# Patient Record
Sex: Male | Born: 1955 | Race: White | Hispanic: No | Marital: Single | Smoking: Never smoker
Health system: Southern US, Community
[De-identification: ages and names within clinical notes are randomized; demographics above are authoritative.]

## PROBLEM LIST (undated history)

## (undated) DIAGNOSIS — I10 Essential (primary) hypertension: Secondary | ICD-10-CM

## (undated) DIAGNOSIS — K219 Gastro-esophageal reflux disease without esophagitis: Secondary | ICD-10-CM

## (undated) DIAGNOSIS — I2510 Atherosclerotic heart disease of native coronary artery without angina pectoris: Secondary | ICD-10-CM

## (undated) DIAGNOSIS — E785 Hyperlipidemia, unspecified: Secondary | ICD-10-CM

## (undated) HISTORY — PX: CORONARY STENT PLACEMENT: SHX1402

## (undated) SURGERY — Surgical Case
Anesthesia: *Unknown

---

## 2004-09-12 ENCOUNTER — Ambulatory Visit: Payer: Self-pay

## 2005-11-12 ENCOUNTER — Ambulatory Visit: Payer: Self-pay

## 2005-12-16 ENCOUNTER — Emergency Department: Payer: Self-pay | Admitting: Unknown Physician Specialty

## 2005-12-16 ENCOUNTER — Other Ambulatory Visit: Payer: Self-pay

## 2007-06-27 ENCOUNTER — Ambulatory Visit: Payer: Self-pay

## 2009-04-12 ENCOUNTER — Ambulatory Visit: Payer: Self-pay

## 2010-10-06 ENCOUNTER — Observation Stay (HOSPITAL_COMMUNITY)
Admission: EM | Admit: 2010-10-06 | Discharge: 2010-10-07 | Disposition: A | Discharge status: Home / Self Care | Payer: Self-pay | Source: Home / Self Care | Attending: Emergency Medicine | Admitting: Emergency Medicine

## 2010-10-06 LAB — POCT CARDIAC MARKERS
CKMB, poc: <1 ng/mL — ABNORMAL LOW (ref 1.0–8.0)
CKMB, poc: <1 ng/mL — ABNORMAL LOW (ref 1.0–8.0)
CKMB, poc: 1.3 ng/mL (ref 1.0–8.0)
Myoglobin, poc: 58.9 ng/mL (ref 12–200)
Troponin i, poc: <0.05 ng/mL (ref 0.00–0.09)
Troponin i, poc: 0.07 ng/mL (ref 0.00–0.09)

## 2010-10-06 LAB — BASIC METABOLIC PANEL
Calcium: 9.4 mg/dL (ref 8.4–10.5)
GFR calc non Af Amer: >60 mL/min (ref >60)
Glucose, Bld: 134 mg/dL — ABNORMAL HIGH (ref 70–99)
Sodium: 140 mEq/L (ref 135–145)

## 2010-10-06 LAB — CBC
HCT: 47 % (ref 39.0–52.0)
Hemoglobin: 16.9 g/dL (ref 13.0–17.0)
MCV: 93.1 fL (ref 78.0–100.0)
RBC: 5.05 MIL/uL (ref 4.22–5.81)
RDW: 12.2 % (ref 11.5–15.5)
WBC: 11.4 10*3/uL — ABNORMAL HIGH (ref 4.0–10.5)

## 2010-10-06 LAB — DIFFERENTIAL
Basophils Absolute: 0.1 10*3/uL (ref 0.0–0.1)
Eosinophils Relative: 2 % (ref 0–5)
Lymphocytes Relative: 17 % (ref 12–46)
Lymphs Abs: 1.9 10*3/uL (ref 0.7–4.0)
Neutro Abs: 8.2 10*3/uL — ABNORMAL HIGH (ref 1.7–7.7)
Neutrophils Relative %: 72 % (ref 43–77)

## 2010-12-04 ENCOUNTER — Emergency Department: Payer: Self-pay

## 2012-01-02 ENCOUNTER — Encounter (HOSPITAL_COMMUNITY): Payer: Self-pay

## 2012-01-02 ENCOUNTER — Inpatient Hospital Stay (HOSPITAL_COMMUNITY)
Admission: EM | Admit: 2012-01-02 | Discharge: 2012-01-04 | DRG: 247 | Disposition: A | Discharge status: Home / Self Care | Payer: Self-pay | Attending: Interventional Cardiology | Admitting: Interventional Cardiology

## 2012-01-02 ENCOUNTER — Encounter (HOSPITAL_COMMUNITY)
Admission: EM | Disposition: A | Discharge status: Home / Self Care | Payer: Self-pay | Source: Home / Self Care | Attending: Interventional Cardiology

## 2012-01-02 ENCOUNTER — Emergency Department (HOSPITAL_COMMUNITY): Payer: Self-pay

## 2012-01-02 ENCOUNTER — Ambulatory Visit (HOSPITAL_COMMUNITY): Admit: 2012-01-02 | Payer: Self-pay | Admitting: Interventional Cardiology

## 2012-01-02 DIAGNOSIS — E785 Hyperlipidemia, unspecified: Secondary | ICD-10-CM | POA: Diagnosis present

## 2012-01-02 DIAGNOSIS — I498 Other specified cardiac arrhythmias: Secondary | ICD-10-CM | POA: Diagnosis present

## 2012-01-02 DIAGNOSIS — Z955 Presence of coronary angioplasty implant and graft: Secondary | ICD-10-CM

## 2012-01-02 DIAGNOSIS — K219 Gastro-esophageal reflux disease without esophagitis: Secondary | ICD-10-CM | POA: Diagnosis present

## 2012-01-02 DIAGNOSIS — I213 ST elevation (STEMI) myocardial infarction of unspecified site: Secondary | ICD-10-CM

## 2012-01-02 DIAGNOSIS — I2119 ST elevation (STEMI) myocardial infarction involving other coronary artery of inferior wall: Principal | ICD-10-CM | POA: Diagnosis present

## 2012-01-02 DIAGNOSIS — I2510 Atherosclerotic heart disease of native coronary artery without angina pectoris: Secondary | ICD-10-CM | POA: Diagnosis present

## 2012-01-02 HISTORY — DX: Hyperlipidemia, unspecified: E78.5

## 2012-01-02 HISTORY — PX: LEFT HEART CATH: SHX5478

## 2012-01-02 HISTORY — DX: Gastro-esophageal reflux disease without esophagitis: K21.9

## 2012-01-02 LAB — BASIC METABOLIC PANEL
Chloride: 105 mEq/L (ref 96–112)
GFR calc Af Amer: >90 mL/min (ref >90)
Potassium: 3.8 mEq/L (ref 3.5–5.1)
Sodium: 140 mEq/L (ref 135–145)

## 2012-01-02 LAB — CBC
HCT: 42.9 % (ref 39.0–52.0)
HCT: 44.4 % (ref 39.0–52.0)
Hemoglobin: 15.8 g/dL (ref 13.0–17.0)
Hemoglobin: 16.2 g/dL (ref 13.0–17.0)
MCH: 33.5 pg (ref 26.0–34.0)
MCH: 33.5 pg (ref 26.0–34.0)
MCV: 91.9 fL (ref 78.0–100.0)
Platelets: 163 10*3/uL (ref 150–400)
RBC: 4.71 MIL/uL (ref 4.22–5.81)
RBC: 4.83 MIL/uL (ref 4.22–5.81)
WBC: 13.3 10*3/uL — ABNORMAL HIGH (ref 4.0–10.5)

## 2012-01-02 LAB — COMPREHENSIVE METABOLIC PANEL
ALT: 43 U/L (ref 0–53)
AST: 57 U/L — ABNORMAL HIGH (ref 0–37)
Alkaline Phosphatase: 70 U/L (ref 39–117)
CO2: 24 mEq/L (ref 19–32)
Calcium: 9 mg/dL (ref 8.4–10.5)
Chloride: 105 mEq/L (ref 96–112)
GFR calc Af Amer: >90 mL/min (ref >90)
GFR calc non Af Amer: >90 mL/min (ref >90)
Glucose, Bld: 93 mg/dL (ref 70–99)
Sodium: 139 mEq/L (ref 135–145)
Total Bilirubin: 0.7 mg/dL (ref 0.3–1.2)

## 2012-01-02 LAB — POCT I-STAT TROPONIN I: Troponin i, poc: 0.06 ng/mL (ref 0.00–0.08)

## 2012-01-02 LAB — DIFFERENTIAL
Eosinophils Absolute: 0 10*3/uL (ref 0.0–0.7)
Eosinophils Relative: 0 % (ref 0–5)
Lymphocytes Relative: 9 % — ABNORMAL LOW (ref 12–46)
Lymphs Abs: 2.3 10*3/uL (ref 0.7–4.0)
Lymphs Abs: 1.2 10*3/uL (ref 0.7–4.0)
Monocytes Absolute: 0.9 10*3/uL (ref 0.1–1.0)
Monocytes Relative: 7 % (ref 3–12)
Monocytes Relative: 7 % (ref 3–12)
Neutro Abs: 12 10*3/uL — ABNORMAL HIGH (ref 1.7–7.7)
Neutrophils Relative %: 77 % (ref 43–77)

## 2012-01-02 LAB — CARDIAC PANEL(CRET KIN+CKTOT+MB+TROPI)
CK, MB: 42 ng/mL (ref 0.3–4.0)
Relative Index: 5.6 — ABNORMAL HIGH (ref 0.0–2.5)

## 2012-01-02 LAB — APTT: aPTT: >200 seconds (ref 24–37)

## 2012-01-02 LAB — PROTIME-INR: INR: 1.11 (ref 0.00–1.49)

## 2012-01-02 LAB — POCT I-STAT, CHEM 8
BUN: 15 mg/dL (ref 6–23)
Chloride: 109 mEq/L (ref 96–112)
Creatinine, Ser: 1.1 mg/dL (ref 0.50–1.35)
Sodium: 142 mEq/L (ref 135–145)
TCO2: 26 mmol/L (ref 0–100)

## 2012-01-02 SURGERY — LEFT HEART CATH
Anesthesia: LOCAL

## 2012-01-02 MED ORDER — NITROGLYCERIN 0.2 MG/ML ON CALL CATH LAB
INTRAVENOUS | Status: AC
  Filled 2012-01-02: qty 1

## 2012-01-02 MED ORDER — ONDANSETRON HCL 4 MG/2ML IJ SOLN
4.0000 mg | Freq: Once | INTRAMUSCULAR | Status: AC
  Administered 2012-01-02: 4 mg via INTRAVENOUS
  Filled 2012-01-02: qty 2

## 2012-01-02 MED ORDER — SODIUM CHLORIDE 0.9 % IV SOLN
0.2500 mg/kg/h | INTRAVENOUS | Status: AC
  Filled 2012-01-02: qty 250

## 2012-01-02 MED ORDER — FENTANYL CITRATE 0.05 MG/ML IJ SOLN
INTRAMUSCULAR | Status: AC
  Filled 2012-01-02: qty 2

## 2012-01-02 MED ORDER — PRASUGREL HCL 10 MG PO TABS
ORAL_TABLET | ORAL | Status: AC
  Filled 2012-01-02: qty 6

## 2012-01-02 MED ORDER — ACETAMINOPHEN 325 MG PO TABS: 650.0000 mg | ORAL_TABLET | ORAL | Status: DC | PRN

## 2012-01-02 MED ORDER — HEPARIN SODIUM (PORCINE) 5000 UNIT/ML IJ SOLN: 60.0000 [IU]/kg | INTRAMUSCULAR | Status: DC

## 2012-01-02 MED ORDER — ASPIRIN 81 MG PO CHEW
324.0000 mg | CHEWABLE_TABLET | ORAL | Status: AC
  Filled 2012-01-02: qty 1

## 2012-01-02 MED ORDER — OXYCODONE-ACETAMINOPHEN 5-325 MG PO TABS: 1.0000 | ORAL_TABLET | ORAL | Status: DC | PRN

## 2012-01-02 MED ORDER — ONDANSETRON HCL 4 MG/2ML IJ SOLN
INTRAMUSCULAR | Status: AC
  Filled 2012-01-02: qty 2

## 2012-01-02 MED ORDER — ONDANSETRON HCL 4 MG/2ML IJ SOLN: 4.0000 mg | Freq: Four times a day (QID) | INTRAMUSCULAR | Status: DC | PRN

## 2012-01-02 MED ORDER — SODIUM CHLORIDE 0.9 % IV SOLN: INTRAVENOUS | Status: DC

## 2012-01-02 MED ORDER — NITROGLYCERIN 0.4 MG SL SUBL: 0.4000 mg | SUBLINGUAL_TABLET | SUBLINGUAL | Status: DC | PRN

## 2012-01-02 MED ORDER — ASPIRIN EC 325 MG PO TBEC
325.0000 mg | DELAYED_RELEASE_TABLET | Freq: Every day | ORAL | Status: DC
  Filled 2012-01-02: qty 1

## 2012-01-02 MED ORDER — HEPARIN SODIUM (PORCINE) 5000 UNIT/ML IJ SOLN
INTRAMUSCULAR | Status: AC
  Administered 2012-01-02: 4000 [IU]
  Filled 2012-01-02: qty 1

## 2012-01-02 MED ORDER — PRASUGREL HCL 10 MG PO TABS
10.0000 mg | ORAL_TABLET | Freq: Every day | ORAL | Status: DC
  Administered 2012-01-03 – 2012-01-04 (×3): 10 mg via ORAL
  Filled 2012-01-02 (×2): qty 1

## 2012-01-02 MED ORDER — ATROPINE SULFATE 1 MG/ML IJ SOLN
INTRAMUSCULAR | Status: AC
  Filled 2012-01-02: qty 1

## 2012-01-02 MED ORDER — LIDOCAINE HCL (PF) 1 % IJ SOLN
INTRAMUSCULAR | Status: AC
  Filled 2012-01-02: qty 30

## 2012-01-02 MED ORDER — SODIUM CHLORIDE 0.9 % IV SOLN
INTRAVENOUS | Status: DC
  Administered 2012-01-03: 01:00:00 via INTRAVENOUS

## 2012-01-02 MED ORDER — METOPROLOL TARTRATE 12.5 MG HALF TABLET
12.5000 mg | ORAL_TABLET | Freq: Two times a day (BID) | ORAL | Status: DC
  Filled 2012-01-02 (×5): qty 1

## 2012-01-02 MED ORDER — MIDAZOLAM HCL 2 MG/2ML IJ SOLN
INTRAMUSCULAR | Status: AC
  Filled 2012-01-02: qty 2

## 2012-01-02 MED ORDER — MORPHINE SULFATE 2 MG/ML IJ SOLN: 2.0000 mg | INTRAMUSCULAR | Status: DC | PRN

## 2012-01-02 MED ORDER — ASPIRIN 300 MG RE SUPP
300.0000 mg | RECTAL | Status: AC
  Filled 2012-01-02: qty 1

## 2012-01-02 MED ORDER — ATORVASTATIN CALCIUM 40 MG PO TABS
40.0000 mg | ORAL_TABLET | Freq: Every day | ORAL | Status: DC
  Administered 2012-01-03: 40 mg via ORAL
  Filled 2012-01-02 (×2): qty 1

## 2012-01-02 MED ORDER — BIVALIRUDIN 250 MG IV SOLR
INTRAVENOUS | Status: AC
  Filled 2012-01-02: qty 250

## 2012-01-02 MED ORDER — MORPHINE SULFATE 4 MG/ML IJ SOLN
4.0000 mg | Freq: Once | INTRAMUSCULAR | Status: AC
  Administered 2012-01-02: 4 mg via INTRAVENOUS
  Filled 2012-01-02: qty 1

## 2012-01-02 MED ORDER — HEPARIN (PORCINE) IN NACL 2-0.9 UNIT/ML-% IJ SOLN
INTRAMUSCULAR | Status: AC
  Filled 2012-01-02: qty 2000

## 2012-01-02 MED ORDER — HEPARIN BOLUS VIA INFUSION
4000.0000 [IU] | Freq: Once | INTRAVENOUS | Status: AC
  Administered 2012-01-02: 4000 [IU] via INTRAVENOUS

## 2012-01-02 NOTE — Progress Notes (Signed)
01/02/12 1535  Discharge Planning  Type of Residence Private residence  Living Arrangements Other relatives  Home Care Services No  Support Systems Other relatives  Do you have any problems obtaining your medications? Yes (Describe) (pt is self pay)  Once you are discharged, how will you get to your follow-up appointment? Self;Family  Expected Discharge Date 01/05/12  Case Management Consult Needed Yes (Comment)  Social Work Consult Needed No

## 2012-01-02 NOTE — ED Notes (Addendum)
EDP reporting calling STEMI. Pt appearing pale, central CP 3/10. Pt diaphoretic.

## 2012-01-02 NOTE — ED Notes (Signed)
Patient reporting substernal chest pain that began minutes ago.  Will obtain another EKG due to EKG changes from EMS.

## 2012-01-02 NOTE — ED Notes (Signed)
Morphine 4mg  was given PTA as well

## 2012-01-02 NOTE — ED Notes (Signed)
Patient now on Zoll monitor.  Family at bedside, patient resting, vitals stable

## 2012-01-02 NOTE — Progress Notes (Signed)
CRITICAL VALUE ALERT  Critical value received:  troponin 4.6 Date of notification: 01/02/12  Time of notification:  1944  Critical value read back:yes  Nurse who received alert:  Alonna Buckler, RN   MD notified (1st page):  Dr. Anne Fu  Time of first page:  2015  MD notified (2nd page):  Time of second page:  Responding MD:  Dr. Anne Fu  Time MD responded:  2015

## 2012-01-02 NOTE — CV Procedure (Signed)
Diagnostic Cardiac Catheterization Report and Emergency Percutaneous Coronary Intervention  Charles Benjamin  56 y.o.  male 03-11-1956  Procedure Date: 01/02/2012 Referring Physician: None Primary Cardiologist:: Wayne Both, III, MD   PROCEDURE:  Left heart catheterization with selective coronary angiography, left ventriculogram, and drug-eluting stent implantation in the mid right coronary artery.  INDICATIONS:  The patient developed chest pain around 12 noon. According to the patient and the emergency room nurse chest discomfort had resolved on arrival. Approximately one hour after arrival he developed severe pain and EKG demonstrated inferior ST elevation at which time the cath lab was activated.  The risks, benefits, and details of the procedure were explained to the patient.  The patient verbalized understanding and wanted to proceed.  Informed written consent was obtained.  PROCEDURE TECHNIQUE:  After Xylocaine anesthesia a 6 French sheath sheath was placed in the right femoral artery with a single anterior needle wall stick.   Coronary angiography was done using a 6 [place] A2 multipurpose catheter catheter.  Left ventriculography was done using a 6 [place] A2 multipurpose catheter catheter.   Angiography demonstrated a double ostial left coronary system with chronic total occlusion of the proximal/mid LAD with left to left collaterals. Circumflex was widely patent and the right coronary was acutely occluded in the mid vessel.  A 6 [place] JR 4 guide catheter was used to obtain guiding shots in the right coronary. 60 mg of Effient was administered and an IV bivalirudin bolus and infusion was started. We had difficulty crossing the lesion due to angulation within the stenosis that preferentially divergent guidewires into acute marginal branches. We were eventually able to cross the stenosis. A catheter-based thrombectomy run was performed. We then dilated and stented the mid right  coronary using a 24 mm long Promus Element 3.0 mm diameter stent. We post dilated to 3.5 mm with high pressure balloons. TIMI grade 3 flow was noted. No complications occurred. Angio-Seal was used with good hemostasis appear   CONTRAST:  Total of 175 cc.  COMPLICATIONS:  None.    HEMODYNAMICS:  Aortic pressure was 124/61 mmHg; LV pressure was 129/15 mmHg; LVEDP 20 mm mercury.  There was no gradient between the left ventricle and aorta.    ANGIOGRAPHIC DATA:   The left main coronary artery is the left coronary system is a double ostial system and there is no left main visible.  The left anterior descending artery is the left anterior descending is totally occluded after the first diagonal branch. Left to left collaterals fill the LAD retrograde.  The left circumflex artery is widely patent and without any significant obstruction..  The right coronary artery is totally occluded in the mid vessel.  PERCUTANEOUS CORONARY INTERVENTION: The right coronary was dilated and stented with a very nice result. The total occlusion was reduced to 0% with TIMI grade 3 flow.  LEFT VENTRICULOGRAM:  Left ventricular angiogram was done in the 30 RAO projection and revealed normal left ventricular wall motion and systolic function with an estimated ejection fraction of 50% with inferior severe hypokinesis.  IMPRESSIONS:  1. Acute inferior myocardial infarction due to plaque rupture and thrombotic occlusion of the mid RCA.  2. Successful DES implantation in the mid RCA reducing the total occlusion to 0% with TIMI grade 3 flow.  3. Double ostial left coronary system . The left anterior descending is chronically occluded in the proximal to mid segment with left to left collaterals. The circumflex artery is widely patent.  4. Low normal left ventricular function with decreased inferior wall motion. EF 50%  RECOMMENDATION:  1. Statin therapy, aspirin therapy, beta blocker therapy, and phase I cardiac  rehabilitation.  2. Effient at and aspirin.  3. PCI on chronic LAD occlusion at a later date.

## 2012-01-02 NOTE — ED Notes (Signed)
CODE STEMI CALLED. 

## 2012-01-02 NOTE — ED Notes (Signed)
Per EMS: patient from home, working in the yard, developed sudden onset substernal chest pain. EMS gave 324 ASA, 3SL nitro with nearly complete relief of pain. Patient has been seen for same before without conclusion to symptoms.

## 2012-01-02 NOTE — ED Notes (Signed)
Patient belongings and wallet given to Ephraim Hamburger, patient's mother

## 2012-01-02 NOTE — H&P (Signed)
Charles Benjamin is a 56 y.o. male  Admit date: 01/02/2012  Referring Physician: ER Primary Cardiologist: Wayne Both, III, MD Chief complaint / reason for admission:  Acute inferior MI  HPI: The patient presented with a complaint of severe chest pain. He was working in his yard around noontime when severe discomfort started. He came to the [facility] emergency room but upon arrival the discomfort had resolved. The pain came back again severely after been in the emergency room for around an hour. EKG was then diagnostic of acute inferior injury and a code stem he was activated the    PMH:    Past Medical History  Diagnosis Date  . Gastro - esophageal reflux   . Hyperlipidemia     PSH:   History reviewed. No pertinent past surgical history.  ALLERGIES:   Review of patient's allergies indicates no known allergies.  Prior to Admit Meds:   Prescriptions prior to admission  Medication Sig Dispense Refill  . DISCONTD: Aspirin (ANACIN PO) Take 1 tablet by mouth daily.       Family HX:   No family history on file. Social HX:    History   Social History  . Marital Status: Single    Spouse Name: N/A    Number of Children: N/A  . Years of Education: N/A   Occupational History  . Not on file.   Social History Main Topics  . Smoking status: Never Smoker   . Smokeless tobacco: Not on file  . Alcohol Use: No  . Drug Use: No  . Sexually Active:    Other Topics Concern  . Not on file   Social History Narrative  . No narrative on file     ROS: No history of bleeding, stroke, diabetes, hyperlipidemia, smoking, COPD, surgeries, or other significant comorbidities.  Both the father and mother have a history of heart disease. The father died recently and it had multiple stents inserted.  Physical Exam: Blood pressure 135/75, pulse 64, temperature 97.8 F (36.6 C), temperature source Oral, resp. rate 18, height 5' 10.5" (1.791 m), weight 83.915 kg (185 lb), SpO2 100.00%.   Pale  and clammy with diaphoretic skin.  HEENT exam is unremarkable.  Chest is clear to auscultation and percussion.  Cardiac exam feels an S4 gallop. No rub, murmur, or gallop is heard.  Abdomen is soft. Bowel sounds are normal. There is no tenderness. No organomegaly.  Extremities reveal no edema. Femoral pulses posterior tibial pulses and radial pulses are 2+.  The neurological exam reveals patient is very anxious. No focal deficits and noted to Labs:   Lab Results  Component Value Date   WBC 15.6* 01/02/2012   HGB 15.6 01/02/2012   HCT 46.0 01/02/2012   MCV 91.1 01/02/2012   PLT 170 01/02/2012    Lab 01/02/12 1535 01/02/12 1520  NA 142 --  K 3.7 --  CL 109 --  CO2 -- 24  BUN 15 --  CREATININE 1.10 --  CALCIUM -- 9.1  PROT -- --  BILITOT -- --  ALKPHOS -- --  ALT -- --  AST -- --  GLUCOSE 103* --   No results found for this basename: CKTOTAL, CKMB, CKMBINDEX, TROPONINI      Radiology:   EKG:  Inferior ST elevation with sinus bradycardia  ASSESSMENT:  1. Acute inferior STEMI.    Plan:  1. Emergency angiography and possible stenting.     Lesleigh Noe 01/02/2012 5:25 PM

## 2012-01-02 NOTE — Progress Notes (Signed)
Patient Charles Benjamin, 56 year old white male, after having a heart attack, arrived via EMS at the E.D., and was then moved to the Cath Lab.  Chaplain provided pastoral presence, prayer, and conversation to patient's mother and friend before, during, and after patient was admitted to the hospital.  Patient's family expressed appreciation for Chaplain's attention.  I will follow-up as needed.

## 2012-01-02 NOTE — ED Notes (Signed)
I gave the patient a warm blanket. 

## 2012-01-02 NOTE — ED Provider Notes (Signed)
History     CSN: [id number]  Arrival date & time 01/02/12  1402   None     Chief Complaint  Patient presents with  . Chest Pain     The history is provided by the patient.   the patient reports developing chest pain and pressure that began while he was working in his garden.  He's had intermittent chest discomfort before including one year ago when he had a stress test is nondiagnostic for ischemia.  He reports that recently he is intermittently developed chest pressure when he has been walking.  At this time he reports ongoing chest discomfort with shortness of breath.  He denies nausea and vomiting.  He has had slight diaphoresis.  EMS was called and the patient was given aspirin and 3 simple lingual nitroglycerin in route.  The patient reports that he had improvement in his symptoms with nitroglycerin but now his pain is worsening.  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  He reports goes to a doctor as needed.  His symptoms are moderate at this time.  There is a family history of heart disease but not early heart disease.  He denies tobacco abuse.  He denies hypertension hyperlipidemia and diabetes.  History reviewed. No pertinent past medical history.  History reviewed. No pertinent past surgical history.  Family history: Coronary artery disease  History  Substance Use Topics  . Smoking status: Never Smoker   . Smokeless tobacco: Not on file  . Alcohol Use: No      Review of Systems  Cardiovascular: Positive for chest pain.  All other systems reviewed and are negative.    Allergies  Review of patient's allergies indicates no known allergies.  Home Medications   Current Outpatient Rx  Name Route Sig Dispense Refill  . ANACIN PO Oral Take 1 tablet by mouth daily.      BP 124/73  Pulse 64  Temp(Src) 97.8 F (36.6 C) (Oral)  Resp 20  SpO2 99%  Physical Exam  Nursing note and vitals  reviewed. Constitutional: He is oriented to person, place, and time. He appears well-developed and well-nourished.       Patient is holding his chest  HENT:  Head: Normocephalic and atraumatic.  Eyes: EOM are normal.  Neck: Normal range of motion.  Cardiovascular: Normal rate, regular rhythm, normal heart sounds and intact distal pulses.   Pulmonary/Chest: Effort normal and breath sounds normal. No respiratory distress.  Abdominal: Soft. He exhibits no distension. There is no tenderness.  Musculoskeletal: Normal range of motion.  Neurological: He is alert and oriented to person, place, and time.  Skin: Skin is warm and dry.  Psychiatric: He has a normal mood and affect. Judgment normal.    ED Course  Procedures (including critical care time)   Date: 01/02/2012 1409 Rate: 54  Rhythm: normal sinus rhythm  QRS Axis: normal  Intervals: normal  ST/T Wave abnormalities: Nonspecific ST changes  Conduction Disutrbances: none  Narrative Interpretation:   Old EKG Reviewed: Nonspecific ST changes   Date: 01/02/2012 1455  Rate: 53  Rhythm: normal sinus rhythm  QRS Axis: normal  Intervals: normal  ST/T Wave abnormalities: Inferior ST elevation in III and early changes in aVF  Conduction Disutrbances: none  Narrative Interpretation:   Old EKG Reviewed: Change from prior EKG at 1409 , changes not suggestive of inferior ST elevation MI    CRITICAL CARE Performed by: Lyanne Co Total critical  care time: 30 Critical care time was exclusive of separately billable procedures and treating other patients. Critical care was necessary to treat or prevent imminent or life-threatening deterioration. Critical care was time spent personally by me on the following activities: development of treatment plan with patient and/or surrogate as well as nursing, discussions with consultants, evaluation of patient's response to treatment, examination of patient, obtaining history from patient or surrogate,  ordering and performing treatments and interventions, ordering and review of laboratory studies, ordering and review of radiographic studies, pulse oximetry and re-evaluation of patient's condition.  Labs Reviewed  CBC - Abnormal; Notable for the following:    WBC 15.6 (*)    MCHC 36.8 (*)    All other components within normal limits  DIFFERENTIAL - Abnormal; Notable for the following:    Neutro Abs 12.0 (*)    Monocytes Absolute 1.1 (*)    All other components within normal limits  APTT - Abnormal; Notable for the following:    aPTT >200 (*)    All other components within normal limits  BASIC METABOLIC PANEL - Abnormal; Notable for the following:    Glucose, Bld 100 (*)    GFR calc non Af Amer 79 (*)    All other components within normal limits  POCT I-STAT, CHEM 8 - Abnormal; Notable for the following:    Glucose, Bld 103 (*)    All other components within normal limits  PROTIME-INR  POCT I-STAT TROPONIN I   No results found.   1. STEMI (ST elevation myocardial infarction)       MDM  The patient's clinical symptoms are suggestive of ACS.  His initial EKG on arrival to the emergency department is nondiagnostic for ST elevation MI.  The patient had ongoing chest pain and at 2:54 PM a repeat EKG was obtained demonstrating what appears to be increasing inferior ST elevation.  He has ongoing chest discomfort at this time.  This appears to be ST elevation MI/acute coronary syndrome.  The Cath Lab was activated and I discussed the case with Dr. Katrinka Blazing who will by with patient either at the bedside or in the Cath Lab.  Heparin bolus at this time.  The patient has been informed of his diagnosis and the ongoing plan.  All questions answered.  Work on pain control this time        Lyanne Co, MD 01/02/12 1623

## 2012-01-03 DIAGNOSIS — I2119 ST elevation (STEMI) myocardial infarction involving other coronary artery of inferior wall: Secondary | ICD-10-CM | POA: Diagnosis present

## 2012-01-03 LAB — LIPID PANEL
Cholesterol: 172 mg/dL (ref 0–200)
LDL Cholesterol: 97 mg/dL (ref 0–99)
Total CHOL/HDL Ratio: 3.2 RATIO
VLDL: 21 mg/dL (ref 0–40)

## 2012-01-03 LAB — CARDIAC PANEL(CRET KIN+CKTOT+MB+TROPI)
CK, MB: 73.1 ng/mL (ref 0.3–4.0)
Relative Index: 7.6 — ABNORMAL HIGH (ref 0.0–2.5)
Total CK: 904 U/L — ABNORMAL HIGH (ref 7–232)
Total CK: 846 U/L — ABNORMAL HIGH (ref 7–232)

## 2012-01-03 LAB — CBC
HCT: 41.8 % (ref 39.0–52.0)
MCH: 32.8 pg (ref 26.0–34.0)
MCV: 92.1 fL (ref 78.0–100.0)
Platelets: 154 10*3/uL (ref 150–400)
RDW: 12.6 % (ref 11.5–15.5)

## 2012-01-03 LAB — BASIC METABOLIC PANEL
BUN: 11 mg/dL (ref 6–23)
Calcium: 8.5 mg/dL (ref 8.4–10.5)
Creatinine, Ser: 0.97 mg/dL (ref 0.50–1.35)
GFR calc Af Amer: >90 mL/min (ref >90)

## 2012-01-03 LAB — HEMOGLOBIN A1C: Hgb A1c MFr Bld: 5.3 % (ref <5.7)

## 2012-01-03 MED ORDER — ENOXAPARIN SODIUM 40 MG/0.4ML ~~LOC~~ SOLN
40.0000 mg | SUBCUTANEOUS | Status: DC
  Administered 2012-01-03 – 2012-01-04 (×2): 40 mg via SUBCUTANEOUS
  Filled 2012-01-03 (×3): qty 0.4

## 2012-01-03 MED ORDER — ASPIRIN EC 81 MG PO TBEC
81.0000 mg | DELAYED_RELEASE_TABLET | Freq: Every day | ORAL | Status: DC
  Administered 2012-01-03 – 2012-01-04 (×2): 81 mg via ORAL
  Filled 2012-01-03 (×2): qty 1

## 2012-01-03 MED FILL — Dextrose Inj 5%: INTRAVENOUS | Qty: 50 | Status: AC

## 2012-01-03 NOTE — Progress Notes (Signed)
Dr. Katha Cabal notified of HR in the 3s.  Will hold beta blockers for the night. Patient denies CP/SOB/Dizziness.  Vital signs stable.  Zoll at bedside, pads in place.

## 2012-01-03 NOTE — Progress Notes (Addendum)
Patient ID: Charles Benjamin, male   DOB: June 09, 1956, 56 y.o.   MRN: [id number] Subjective:  55 STEMI, inferior, DES to RCA, subtotal LAD (hopeful opened at later date).   Doing well. No SOB, no CP. Ambulating. Groin OK.   Had some bradycardia overnight 48-50. Asymptomatic. Metoprolol 12.5 held.   Had 16 beat narrow complex tachycardia. SVT. 130am.    Objective:  Vital Signs in the last 24 hours: Temp:  [97.8 F (36.6 C)-98.7 F (37.1 C)] 98.7 F (37.1 C) (04/28 0300) Pulse Rate:  [45-76] 52  (04/28 0700) Resp:  [10-20] 13  (04/28 0700) BP: (98-135)/(59-88) 107/63 mmHg (04/28 0700) SpO2:  [96 %-100 %] 98 % (04/28 0800) Weight:  [83.2 kg (183 lb 6.8 oz)-84.1 kg (185 lb 6.5 oz)] 84.1 kg (185 lb 6.5 oz) (04/28 0500)  Intake/Output from previous day: 04/27 0701 - 04/28 0700 In: 2073.8 [P.O.:480; I.V.:1593.8] Out: 1175 [Urine:1175]   Physical Exam: General: Well developed, well nourished, in no acute distress. Head:  Normocephalic and atraumatic. Lungs: Clear to auscultation and percussion. Heart: Normal S1 and S2.  No murmur, rubs or gallops.  Pulses: Pulses normal in all 4 extremities. Cath - angioseal, no hematoma, no bruit Abdomen: soft, non-tender, positive bowel sounds. Extremities: No clubbing or cyanosis. No edema. Neurologic: Alert and oriented x 3.    Lab Results:  Basename 01/03/12 0550 01/02/12 1815  WBC 8.6 13.3*  HGB 14.9 16.2  PLT 154 163    Basename 01/03/12 0550 01/02/12 1815  NA 138 139  K 4.0 4.2  CL 106 105  CO2 23 24  GLUCOSE 93 93  BUN 11 13  CREATININE 0.97 0.94    Basename 01/03/12 0550 01/03/12 0008  TROPONINI 16.08* 23.55*   Hepatic Function Panel  Basename 01/02/12 1815  PROT 6.7  ALBUMIN 4.0  AST 57*  ALT 43  ALKPHOS 70  BILITOT 0.7  BILIDIR --  IBILI --    Basename 01/03/12 0550  CHOL 172   No results found for this basename: PROTIME in the last 72 hours  Imaging: Dg Chest Port 1 View  01/02/2012  *RADIOLOGY REPORT*   Clinical Data: Chest pain and shortness of breath.  PORTABLE CHEST - 1 VIEW  Comparison: 10/06/2010.  Findings: Interval borderline enlarged cardiac silhouette.  Clear lungs with normal vascularity.  Normal appearing bones.  IMPRESSION: Borderline cardiomegaly.  Original Report Authenticated By: Charles Benjamin, M.D.   Personally viewed.   Telemetry: SVT as above, brady also Personally viewed.   EKG:  SB 51, inf infarct pattern, no ST changes  Assessment/Plan:  Principal Problem:  *Inferior myocardial infarction  STEMI - Inferior. DES to RCA, Effient, ASA. Brady overnight while sleeping. Currently 58 bpm. Given MI, will try low dose Bb 12.5 metoprolol.  Transfer to tele.  LDL goal 70, now 97 Will stop IVF  Cardiac rehab  CAD - subtotal LAD. Later date intervention. Attempt to recanalize.  Discussed with patient.   Ambulate.  DVT prophal. Charles Benjamin 01/03/2012, 8:03 AM

## 2012-01-04 LAB — BASIC METABOLIC PANEL
BUN: 13 mg/dL (ref 6–23)
Calcium: 9 mg/dL (ref 8.4–10.5)
Creatinine, Ser: 1.02 mg/dL (ref 0.50–1.35)
GFR calc non Af Amer: 82 mL/min — ABNORMAL LOW (ref >90)
Glucose, Bld: 98 mg/dL (ref 70–99)
Sodium: 139 mEq/L (ref 135–145)

## 2012-01-04 LAB — CBC
MCH: 32.5 pg (ref 26.0–34.0)
MCHC: 34.9 g/dL (ref 30.0–36.0)
Platelets: 141 10*3/uL — ABNORMAL LOW (ref 150–400)

## 2012-01-04 LAB — POCT ACTIVATED CLOTTING TIME: Activated Clotting Time: 523 seconds

## 2012-01-04 MED ORDER — METOPROLOL SUCCINATE ER 25 MG PO TB24: 25.0000 mg | ORAL_TABLET | Freq: Every day | ORAL | Status: DC

## 2012-01-04 MED ORDER — PRAVASTATIN SODIUM 40 MG PO TABS: 40.0000 mg | ORAL_TABLET | Freq: Every day | ORAL | Status: DC

## 2012-01-04 MED ORDER — METOPROLOL SUCCINATE ER 25 MG PO TB24
25.0000 mg | ORAL_TABLET | Freq: Every day | ORAL | Status: DC
  Administered 2012-01-04: 25 mg via ORAL
  Filled 2012-01-04: qty 1

## 2012-01-04 MED ORDER — HEART ATTACK BOUNCING BOOK
Freq: Once | Status: AC
  Administered 2012-01-04: 11:00:00
  Filled 2012-01-04: qty 1

## 2012-01-04 MED ORDER — NITROGLYCERIN 0.4 MG SL SUBL: 0.4000 mg | SUBLINGUAL_TABLET | SUBLINGUAL | Status: DC | PRN

## 2012-01-04 MED ORDER — ASPIRIN EC 325 MG PO TBEC: 325.0000 mg | DELAYED_RELEASE_TABLET | Freq: Every morning | ORAL | Status: AC

## 2012-01-04 MED ORDER — PRASUGREL HCL 10 MG PO TABS: 10.0000 mg | ORAL_TABLET | Freq: Every day | ORAL | Status: DC

## 2012-01-04 MED ORDER — ATORVASTATIN CALCIUM 40 MG PO TABS: 40.0000 mg | ORAL_TABLET | Freq: Every day | ORAL | Status: DC

## 2012-01-04 MED ORDER — ACTIVE PARTNERSHIP FOR HEALTH OF YOUR HEART BOOK
Freq: Once | Status: AC
  Administered 2012-01-04: 11:00:00
  Filled 2012-01-04: qty 1

## 2012-01-04 NOTE — Progress Notes (Signed)
Pt received discharge instructions. Went over each medication and importance of taking them. Pt received heart attack book and heart healthy book. Gave print outs on metoprolol and heart healthy diet. Pt received effient card for prescription and is aware to get at [facility] for cheapest price. Knows when follow up appointment is. Will take BP/HR regularly and record to bring to appointment, along with prescriptions. Went over incision site care and when to call the doctor. Pt has no further questions at this time. Stable for D/C. Will D/C IV and tele once ride is here. Duwaine Maxin, RN

## 2012-01-04 NOTE — Discharge Instructions (Signed)
Follow the progression instructions of cardiac rehab and no work until returns to see Dr Katrinka Blazing.

## 2012-01-04 NOTE — Discharge Summary (Addendum)
Patient ID: Charles Benjamin MRN: [id number] DOB/AGE: [birth year] 56 y.o.  Admit date: 01/02/2012 Discharge date: 01/04/2012  Primary Discharge Diagnosis: Acute inferior STEMI Secondary Discharge Diagnosis :1. Hyperlipidemia  Significant Diagnostic Studies: Emergency acth and DES RCA, 01/02/12  Consults: Cardiac Rehab  Hospital Course: He presented with chest pain that occurred while mowing. He was pain free upon ER arrival and noted to have recurrent CP and STE inferiorly one hour after presentation. He underwent successful RCA DES and was also noted to have chronic total RCA occlusion. He had no recurrence of symptoms following the procedure. Had Cardiac Rehab I and did well.  Will plan to do PCI on the LAD CTO in 2-3 weeks.   Discharge Exam: Blood pressure 116/68, pulse 56, temperature 98.3 F (36.8 C), temperature source Oral, resp. rate 16, height 5\' 11"  (1.803 m), weight 84.6 kg (186 lb 8.2 oz), SpO2 97.00%.    The right groin cath site is unremarkable. Cardiac exam is unremarkable. Neuro okay   Labs:   Lab Results  Component Value Date   WBC 7.2 01/04/2012   HGB 15.4 01/04/2012   HCT 44.1 01/04/2012   MCV 93.0 01/04/2012   PLT 141* 01/04/2012     Lab 01/04/12 0130 01/02/12 1815  NA 139 --  K 3.8 --  CL 105 --  CO2 24 --  BUN 13 --  CREATININE 1.02 --  CALCIUM 9.0 --  PROT -- 6.7  BILITOT -- 0.7  ALKPHOS -- 70  ALT -- 43  AST -- 57*  GLUCOSE 98 --   Lab Results  Component Value Date   CKTOTAL 846* 01/03/2012   CKMB 64.0* 01/03/2012   TROPONINI 16.08* 01/03/2012    Lab Results  Component Value Date   CHOL 172 01/03/2012   Lab Results  Component Value Date   HDL 54 01/03/2012   Lab Results  Component Value Date   LDLCALC 97 01/03/2012   Lab Results  Component Value Date   TRIG 104 01/03/2012   Lab Results  Component Value Date   CHOLHDL 3.2 01/03/2012      Radiology: Mild cardiomegaly  EKG: NSR with small inferior Q waves and TWI in 3 and  F  FOLLOW UP PLANS AND APPOINTMENTS Discharge Orders    Future Orders Please Complete By Expires   Amb Referral to Cardiac Rehabilitation      Comments:   Going to [facility] Phase 2     Medication List  As of 01/04/2012 10:31 AM   STOP taking these medications         ANACIN PO         TAKE these medications         aspirin EC 325 MG tablet   Take 1 tablet (325 mg total) by mouth every morning.      metoprolol succinate 25 MG 24 hr tablet   Commonly known as: TOPROL-XL   Take 1 tablet (25 mg total) by mouth daily.      nitroGLYCERIN 0.4 MG SL tablet   Commonly known as: NITROSTAT   Place 1 tablet (0.4 mg total) under the tongue every 5 (five) minutes x 3 doses as needed for chest pain.      prasugrel 10 MG Tabs   Commonly known as: EFFIENT   Take 1 tablet (10 mg total) by mouth daily.      pravastatin 40 MG tablet   Commonly known as: PRAVACHOL   Take 1 tablet (40 mg total) by mouth  daily.           Follow-up Information    Follow up with Lesleigh Noe, MD on 01/11/2012. (11:15 am)    Contact information:   [address] [place] [address] [place] [address] [phone]          BRING ALL MEDICATIONS WITH YOU TO FOLLOW UP APPOINTMENTS  Time spent with patient to include physician time: 30 min Signed: Lesleigh Noe 01/04/2012, 10:31 AM

## 2012-01-04 NOTE — Progress Notes (Signed)
CARDIAC REHAB PHASE I   PRE:  Rate/Rhythm: 59 SB    BP: sitting 110/60    SaO2:   MODE:  Ambulation: 460 ft   POST:  Rate/Rhythm: 72 SR    BP: sitting 128/78     SaO2:   Tolerated well, no c/o. Feels good. Sts he has had chest tightness for years and even came to ER 1 yr ago with negative GXT. Ed completed and pt requests his name be [facility] to [facility]. Eager to be compliant. Does not have insurance so will need Effient assist. Notified CM. [phone]  Harriet Masson CES, ACSM    59 SB

## 2012-01-04 NOTE — Progress Notes (Signed)
UR Completed. Simmons, Debie Ashline F [phone]  

## 2012-01-04 NOTE — Progress Notes (Signed)
   CARE MANAGEMENT NOTE 01/04/2012  Patient:  Charles Benjamin, Charles Benjamin   Account Number:  [id number]  Date Initiated:  01/04/2012  Documentation initiated by:  GRAVES-BIGELOW,Ivett Luebbe  Subjective/Objective Assessment:   Pt admitted with stemi. S/p LHC. Plan for home on effient. Pt does not have insurance and does not work. Pt uses [facility] and CM did call [address] and effient is available. He will also pick up nitro.     Action/Plan:   CM did ,make pt aware of [facility] $4.00 med list and he will pick up statin, asa and metoprolol. Pt states he has PCP and his mom will be able to help him purchase meds. CM will fax pt assist form to [facility].   Anticipated DC Date:  01/04/2012   Anticipated DC Plan:  HOME/SELF CARE      DC Planning Services  CM consult      Choice offered to / List presented to:             Status of service:  Completed, signed off Medicare Important Message given?   (If response is "NO", the following Medicare IM given date fields will be blank) Date Medicare IM given:   Date Additional Medicare IM given:    Discharge Disposition:  HOME/SELF CARE  Per UR Regulation:    If discussed at Long Length of Stay Meetings, dates discussed:    Comments:  01-04-12 1059 Tomi Bamberger, RN,BSN [phone] CM did make pt aware that he needs to be compliant with medications. CM did state to pt that if he  is not approved by the time the 30 day free effient runs out to contact MD and to ask for samples. RN to call CM at d/c in order to fax [facility] forms and Rx to [facility]. Pt is aware of the pharmacies that have cheaper prices. Will continue to f/u.

## 2013-01-25 ENCOUNTER — Inpatient Hospital Stay (HOSPITAL_COMMUNITY)
Admission: EM | Admit: 2013-01-25 | Discharge: 2013-01-27 | DRG: 287 | Disposition: A | Discharge status: Home / Self Care | Payer: No Typology Code available for payment source | Attending: Interventional Cardiology | Admitting: Interventional Cardiology

## 2013-01-25 ENCOUNTER — Emergency Department (HOSPITAL_COMMUNITY): Payer: No Typology Code available for payment source

## 2013-01-25 ENCOUNTER — Encounter (HOSPITAL_COMMUNITY): Payer: Self-pay | Admitting: Emergency Medicine

## 2013-01-25 DIAGNOSIS — I200 Unstable angina: Secondary | ICD-10-CM

## 2013-01-25 DIAGNOSIS — I2582 Chronic total occlusion of coronary artery: Secondary | ICD-10-CM | POA: Diagnosis present

## 2013-01-25 DIAGNOSIS — Z87891 Personal history of nicotine dependence: Secondary | ICD-10-CM

## 2013-01-25 DIAGNOSIS — I471 Supraventricular tachycardia, unspecified: Secondary | ICD-10-CM

## 2013-01-25 DIAGNOSIS — K219 Gastro-esophageal reflux disease without esophagitis: Secondary | ICD-10-CM | POA: Diagnosis present

## 2013-01-25 DIAGNOSIS — I498 Other specified cardiac arrhythmias: Secondary | ICD-10-CM | POA: Diagnosis present

## 2013-01-25 DIAGNOSIS — Z7982 Long term (current) use of aspirin: Secondary | ICD-10-CM

## 2013-01-25 DIAGNOSIS — I2510 Atherosclerotic heart disease of native coronary artery without angina pectoris: Principal | ICD-10-CM

## 2013-01-25 DIAGNOSIS — Z79899 Other long term (current) drug therapy: Secondary | ICD-10-CM

## 2013-01-25 DIAGNOSIS — I10 Essential (primary) hypertension: Secondary | ICD-10-CM

## 2013-01-25 DIAGNOSIS — E785 Hyperlipidemia, unspecified: Secondary | ICD-10-CM | POA: Diagnosis present

## 2013-01-25 DIAGNOSIS — E7800 Pure hypercholesterolemia, unspecified: Secondary | ICD-10-CM

## 2013-01-25 DIAGNOSIS — I252 Old myocardial infarction: Secondary | ICD-10-CM

## 2013-01-25 HISTORY — DX: Essential (primary) hypertension: I10

## 2013-01-25 HISTORY — DX: Atherosclerotic heart disease of native coronary artery without angina pectoris: I25.10

## 2013-01-25 LAB — COMPREHENSIVE METABOLIC PANEL
Alkaline Phosphatase: 61 U/L (ref 39–117)
BUN: 13 mg/dL (ref 6–23)
CO2: 19 mEq/L (ref 19–32)
Chloride: 103 mEq/L (ref 96–112)
Creatinine, Ser: 0.91 mg/dL (ref 0.50–1.35)
GFR calc non Af Amer: >90 mL/min (ref >90)
Glucose, Bld: 95 mg/dL (ref 70–99)
Potassium: 3.6 mEq/L (ref 3.5–5.1)
Total Bilirubin: 0.7 mg/dL (ref 0.3–1.2)

## 2013-01-25 LAB — BASIC METABOLIC PANEL
BUN: 14 mg/dL (ref 6–23)
Calcium: 10.1 mg/dL (ref 8.4–10.5)
Creatinine, Ser: 0.94 mg/dL (ref 0.50–1.35)
GFR calc Af Amer: >90 mL/min (ref >90)
GFR calc non Af Amer: >90 mL/min (ref >90)
Glucose, Bld: 101 mg/dL — ABNORMAL HIGH (ref 70–99)
Potassium: 4 mEq/L (ref 3.5–5.1)

## 2013-01-25 LAB — CBC
HCT: 47.9 % (ref 39.0–52.0)
Hemoglobin: 18 g/dL — ABNORMAL HIGH (ref 13.0–17.0)
MCH: 32.9 pg (ref 26.0–34.0)
MCHC: 37.6 g/dL — ABNORMAL HIGH (ref 30.0–36.0)
RDW: 12 % (ref 11.5–15.5)

## 2013-01-25 LAB — TROPONIN I: Troponin I: <0.3 ng/mL (ref <0.30)

## 2013-01-25 LAB — PROTIME-INR: INR: 0.9 (ref 0.00–1.49)

## 2013-01-25 MED ORDER — SODIUM CHLORIDE 0.9 % IV SOLN: 250.0000 mL | INTRAVENOUS | Status: DC | PRN

## 2013-01-25 MED ORDER — ASPIRIN 300 MG RE SUPP
300.0000 mg | RECTAL | Status: AC
  Filled 2013-01-25: qty 1

## 2013-01-25 MED ORDER — SODIUM CHLORIDE 0.9 % IJ SOLN: 3.0000 mL | INTRAMUSCULAR | Status: DC | PRN

## 2013-01-25 MED ORDER — NITROGLYCERIN 0.4 MG SL SUBL
0.4000 mg | SUBLINGUAL_TABLET | SUBLINGUAL | Status: DC | PRN
  Administered 2013-01-25: 0.4 mg via SUBLINGUAL

## 2013-01-25 MED ORDER — ASPIRIN EC 325 MG PO TBEC
325.0000 mg | DELAYED_RELEASE_TABLET | Freq: Every day | ORAL | Status: DC
Start: 2013-01-27 — End: 2013-01-27
  Administered 2013-01-27: 09:00:00 325 mg via ORAL
  Filled 2013-01-25: qty 1

## 2013-01-25 MED ORDER — SODIUM CHLORIDE 0.9 % IJ SOLN
3.0000 mL | Freq: Two times a day (BID) | INTRAMUSCULAR | Status: DC
  Administered 2013-01-25 – 2013-01-26 (×2): 3 mL via INTRAVENOUS

## 2013-01-25 MED ORDER — ASPIRIN 325 MG PO TABS
325.0000 mg | ORAL_TABLET | ORAL | Status: AC
  Administered 2013-01-25: 325 mg via ORAL
  Filled 2013-01-25: qty 1

## 2013-01-25 MED ORDER — ASPIRIN 81 MG PO CHEW: 324.0000 mg | CHEWABLE_TABLET | ORAL | Status: AC

## 2013-01-25 MED ORDER — SODIUM CHLORIDE 0.9 % IV SOLN
INTRAVENOUS | Status: DC
  Administered 2013-01-25 – 2013-01-26 (×2): via INTRAVENOUS

## 2013-01-25 MED ORDER — METOPROLOL SUCCINATE ER 25 MG PO TB24
25.0000 mg | ORAL_TABLET | Freq: Every day | ORAL | Status: DC
  Administered 2013-01-25 – 2013-01-26 (×2): 25 mg via ORAL
  Filled 2013-01-25 (×3): qty 1

## 2013-01-25 MED ORDER — ACETAMINOPHEN 325 MG PO TABS
650.0000 mg | ORAL_TABLET | ORAL | Status: DC | PRN
  Administered 2013-01-26: 650 mg via ORAL
  Filled 2013-01-25: qty 2

## 2013-01-25 MED ORDER — HEPARIN BOLUS VIA INFUSION
4000.0000 [IU] | Freq: Once | INTRAVENOUS | Status: AC
  Administered 2013-01-25: 4000 [IU] via INTRAVENOUS

## 2013-01-25 MED ORDER — ONDANSETRON HCL 4 MG/2ML IJ SOLN
4.0000 mg | Freq: Four times a day (QID) | INTRAMUSCULAR | Status: DC | PRN
  Administered 2013-01-26: 4 mg via INTRAVENOUS
  Filled 2013-01-25: qty 2

## 2013-01-25 MED ORDER — DIAZEPAM 5 MG PO TABS
5.0000 mg | ORAL_TABLET | ORAL | Status: AC
  Administered 2013-01-26: 5 mg via ORAL
  Filled 2013-01-25: qty 1

## 2013-01-25 MED ORDER — ASPIRIN EC 325 MG PO TBEC: 325.0000 mg | DELAYED_RELEASE_TABLET | Freq: Every day | ORAL | Status: DC

## 2013-01-25 MED ORDER — SIMVASTATIN 20 MG PO TABS
20.0000 mg | ORAL_TABLET | Freq: Every day | ORAL | Status: DC
  Administered 2013-01-26: 20 mg via ORAL
  Filled 2013-01-25 (×4): qty 1

## 2013-01-25 MED ORDER — ISOSORBIDE MONONITRATE ER 30 MG PO TB24
30.0000 mg | ORAL_TABLET | Freq: Every day | ORAL | Status: DC
  Administered 2013-01-25: 30 mg via ORAL
  Filled 2013-01-25 (×2): qty 1

## 2013-01-25 MED ORDER — NITROGLYCERIN 0.4 MG SL SUBL: 0.4000 mg | SUBLINGUAL_TABLET | SUBLINGUAL | Status: DC | PRN

## 2013-01-25 MED ORDER — ASPIRIN 81 MG PO CHEW
324.0000 mg | CHEWABLE_TABLET | ORAL | Status: AC
  Administered 2013-01-26: 324 mg via ORAL
  Filled 2013-01-25 (×2): qty 4

## 2013-01-25 MED ORDER — HEPARIN (PORCINE) IN NACL 100-0.45 UNIT/ML-% IJ SOLN
950.0000 [IU]/h | INTRAMUSCULAR | Status: DC
  Administered 2013-01-25: 1200 [IU]/h via INTRAVENOUS
  Administered 2013-01-26: 950 [IU]/h via INTRAVENOUS
  Filled 2013-01-25 (×3): qty 250

## 2013-01-25 NOTE — ED Notes (Signed)
Patient transported to X-ray 

## 2013-01-25 NOTE — H&P (Signed)
Admit date: 01/25/2013 Primary Physician: None on file Primary Cardiologist: Dr. Katrinka Blazing  CC: Chest pain  HPI: 57 year old with coronary artery disease status post inferior myocardial infarction with RCA stent placement, CTO of LAD, former smoker, GERD, hyperlipidemia, hypertension who presented with onset of angina at rest, 3 AM this morning, took Pepcid without relief of chest pressure/tightness with mild diaphoresis. He states that this feels different from his GERD pain. He has not had cholecystectomy. No change with food. Over the past 3-4 weeks he has felt some increase in discomfort which can occur at both rest and with exertion. He works at [employer]. In the past, some of his chest discomfort has felt to be musculoskeletal. He received nitroglycerin and this seemed to help with his discomfort. He was placed on IV heparin in the emergency room. Initial troponin is normal. EKG does not demonstrate ST segment elevation or depression but does show evidence of paroxysmal atrial tachycardia. He does not endorse palpitations.  His inferior myocardial infarction with RCA stent placement occurred on 01/02/12.   Catheterization: 01/02/12  PERCUTANEOUS CORONARY INTERVENTION: The right coronary was dilated and stented with a very nice result. The total occlusion was reduced to 0% with TIMI grade 3 flow.  LEFT VENTRICULOGRAM: Left ventricular angiogram was done in the 30 RAO projection and revealed normal left ventricular wall motion and systolic function with an estimated ejection fraction of 50% with inferior severe hypokinesis.  IMPRESSIONS: 1. Acute inferior myocardial infarction due to plaque rupture and thrombotic occlusion of the mid RCA.  2. Successful DES implantation in the mid RCA reducing the total occlusion to 0% with TIMI grade 3 flow.  3. Double ostial left coronary system . The left anterior descending is chronically occluded in the proximal to mid segment with left to left collaterals. The  circumflex artery is widely patent.  4. Low normal left ventricular function with decreased inferior wall motion. EF 50%   PMH:   Past Medical History  Diagnosis Date  . Gastro - esophageal reflux   . Hyperlipidemia   . Coronary artery disease     Inferior myocardial infarction-RCA DES, stent 01/02/12, CTO of prox-mid LAD  . Hypertension     PSH:   Past Surgical History  Procedure Laterality Date  . Coronary stent placement     Allergies:  Shrimp and Pravastatin  Prior to Admit Meds:   (Not in a hospital admission) Prior to Admission medications   Medication Sig Start Date End Date Taking? Authorizing Provider  aspirin 325 MG EC tablet Take 325 mg by mouth daily.   Yes Historical Provider, MD  bismuth subsalicylate (PEPTO BISMOL) 262 MG chewable tablet Chew 524 mg by mouth as needed for indigestion or heartburn.   Yes Historical Provider, MD  metoprolol succinate (TOPROL-XL) 25 MG 24 hr tablet Take 1 tablet (25 mg total) by mouth daily. 01/04/12 01/03/13  Quintella Reichert, MD  nitroGLYCERIN (NITROSTAT) 0.4 MG SL tablet Place 1 tablet (0.4 mg total) under the tongue every 5 (five) minutes x 3 doses as needed for chest pain. 01/04/12 01/03/13  Quintella Reichert, MD  pravastatin (PRAVACHOL) 40 MG tablet Take 1 tablet (40 mg total) by mouth daily. 01/04/12 01/03/13  Quintella Reichert, MD   Fam HX:   No family history on file. Social HX:    History   Social History  . Marital Status: Single    Spouse Name: N/A    Number of Children: N/A  . Years of Education: N/A  Occupational History  . Not on file.   Social History Main Topics  . Smoking status: Never Smoker   . Smokeless tobacco: Not on file  . Alcohol Use: No  . Drug Use: No  . Sexually Active: Not on file   Other Topics Concern  . Not on file   Social History Narrative  . No narrative on file     ROS:  All 11 ROS were addressed and are negative except what is stated in the HPI  Physical Exam: Blood pressure 118/85, pulse  37, temperature 97.6 F (36.4 C), temperature source Oral, resp. rate 9, SpO2 98.00%.    General: Well developed, well nourished, in no acute distress, mildly anxious Head: Eyes PERRLA, No xanthomas.   Normal cephalic and atramatic  Lungs:   Clear bilaterally to auscultation and percussion. Normal respiratory effort. No wheezes, no rales. Heart:   HRRR S1 S2 Pulses are 2+ & equal.No murmurs, rubs, gallops.   No carotid bruit. No JVD.  No abdominal bruits. No femoral bruits. Abdomen: Bowel sounds are positive, abdomen soft and non-tender without masses. No hepatosplenomegaly. Msk:  Back normal, normal gait. Normal strength and tone for age. Extremities:   No clubbing, cyanosis or edema.  DP +1 Neuro: Alert and oriented X 3, non-focal, MAE x 4 GU: Deferred Rectal: Deferred Psych:  Good affect, responds appropriately    Labs:   Lab Results  Component Value Date   WBC 5.7 01/25/2013   HGB 18.0* 01/25/2013   HCT 47.9 01/25/2013   MCV 87.6 01/25/2013   PLT 176 01/25/2013     Recent Labs Lab 01/25/13 0709  NA 139  K 4.0  CL 103  CO2 22  BUN 14  CREATININE 0.94  CALCIUM 10.1  GLUCOSE 101*   No results found for this basename: PTT   Lab Results  Component Value Date   INR 0.90 01/25/2013   INR 1.11 01/02/2012   Lab Results  Component Value Date   CKTOTAL 846* 01/03/2012   CKMB 64.0* 01/03/2012   TROPONINI 16.08* 01/03/2012     Lab Results  Component Value Date   CHOL 172 01/03/2012   Lab Results  Component Value Date   HDL 54 01/03/2012   Lab Results  Component Value Date   LDLCALC 97 01/03/2012   Lab Results  Component Value Date   TRIG 104 01/03/2012   Lab Results  Component Value Date   CHOLHDL 3.2 01/03/2012   No results found for this basename: LDLDIRECT      Radiology:  Dg Chest 2 View  01/25/2013   *RADIOLOGY REPORT*  Clinical Data: Chest pain.  Shortness of breath.  CHEST - 2 VIEW  Comparison: 01/02/2012 and 10/06/2010  Findings: The heart size and  pulmonary vascularity are normal and the lungs are clear.  No osseous abnormality. Coronary artery stents noted.  IMPRESSION: No acute abnormality.   Original Report Authenticated By: Francene Boyers, M.D.   Personally viewed.   EKG:  Normal sinus rhythm, no ST segment changes, no obvious Q waves inferiorly, short bursts of paroxysmal atrial tachycardia, P waves clear. Prior EKG done in office setting demonstrates sinus rhythm. Personally viewed.  ASSESSMENT/PLAN:   57 year old male with coronary artery disease, RCA DES, complete occlusion of proximal to mid LAD, inferior wall myocardial infarction 4/13, hypertension, hyperlipidemia, former smoker here with chest pain concerning for unstable angina.   1. Unstable angina-agree with IV heparin, aspirin, beta blocker, statin. Based upon his story, I will plan for  cardiac catheterization tomorrow by Dr. Katrinka Blazing. N.p.o. past midnight. Perhaps, correction of complete occlusion of LAD may help with symptoms. It is likely that nuclear stress test will demonstrate ischemia in the anterior wall territory. Left to left collaterals fill the LAD retrograde. He is having ongoing symptoms despite antianginal therapy of metoprolol at home. Nitroglycerin has been utilize sublingual as well. I will start isosorbide 30 mg. I will hold on giving clopidogrel in case surgical correction of LAD is warranted. Nonetheless, it would likely be started tomorrow.  2. Hyperlipidemia-continue with statin therapy, check LDL  3. Hypertension-currently well controlled. Continue with metoprolol.  Donato Schultz, MD  01/25/2013  9:31 AM

## 2013-01-25 NOTE — ED Provider Notes (Addendum)
History     CSN: [id number]  Arrival date & time 01/25/13  4098   First MD Initiated Contact with Patient 01/25/13 [phone]      Chief Complaint  Patient presents with  . Chest Pain    (Consider location/radiation/quality/duration/timing/severity/associated sxs/prior treatment) HPI Comments: Pt with hx of CAD, s/p DES stent to RCA last year, HL, comes in with cc of chest pain. Pt started having chest pain x several weeks and is mid-sternal, described as pressure like pain with some associated nausea, diaphoresis. The pain is typically associated with walking and improved with rest. The pain is not same as his MI. He denies any illicit substance abuse. He comes in today, as the pain frequency is increasing, and because he had pain while sleeping today.  Patient is a 57 y.o. male presenting with chest pain. The history is provided by the patient and medical records.  Chest Pain Associated symptoms: diaphoresis and nausea   Associated symptoms: no cough, no dizziness, no fever, no headache and no shortness of breath     Past Medical History  Diagnosis Date  . Gastro - esophageal reflux   . Hyperlipidemia   . Coronary artery disease   . Hypertension     Past Surgical History  Procedure Laterality Date  . Coronary stent placement      No family history on file.  History  Substance Use Topics  . Smoking status: Never Smoker   . Smokeless tobacco: Not on file  . Alcohol Use: No      Review of Systems  Constitutional: Positive for diaphoresis. Negative for fever, chills and activity change.  HENT: Negative for neck pain.   Eyes: Negative for visual disturbance.  Respiratory: Negative for cough, chest tightness and shortness of breath.   Cardiovascular: Positive for chest pain.  Gastrointestinal: Positive for nausea. Negative for abdominal distention.  Genitourinary: Negative for dysuria, enuresis and difficulty urinating.  Musculoskeletal: Negative for arthralgias.   Neurological: Negative for dizziness, light-headedness and headaches.  Psychiatric/Behavioral: Negative for confusion.    Allergies  Shrimp and Pravastatin  Home Medications   Current Outpatient Rx  Name  Route  Sig  Dispense  Refill  . aspirin 325 MG EC tablet   Oral   Take 325 mg by mouth daily.         Marland Kitchen bismuth subsalicylate (PEPTO BISMOL) 262 MG chewable tablet   Oral   Chew 524 mg by mouth as needed for indigestion or heartburn.         Marland Kitchen EXPIRED: metoprolol succinate (TOPROL-XL) 25 MG 24 hr tablet   Oral   Take 1 tablet (25 mg total) by mouth daily.   30 tablet   11   . EXPIRED: nitroGLYCERIN (NITROSTAT) 0.4 MG SL tablet   Sublingual   Place 1 tablet (0.4 mg total) under the tongue every 5 (five) minutes x 3 doses as needed for chest pain.   25 tablet   5   . EXPIRED: pravastatin (PRAVACHOL) 40 MG tablet   Oral   Take 1 tablet (40 mg total) by mouth daily.   30 tablet   11     BP 162/82  Pulse 74  Temp(Src) 97.6 F (36.4 C) (Oral)  Resp 18  SpO2 97%  Physical Exam  Nursing note and vitals reviewed. Constitutional: He is oriented to person, place, and time. He appears well-developed.  HENT:  Head: Normocephalic and atraumatic.  Eyes: Conjunctivae and EOM are normal. Pupils are equal, round, and reactive  to light.  Neck: Normal range of motion. Neck supple.  Cardiovascular: Normal rate and regular rhythm.   Pulmonary/Chest: Effort normal and breath sounds normal.  Abdominal: Soft. Bowel sounds are normal. He exhibits no distension. There is no tenderness. There is no rebound and no guarding.  Neurological: He is alert and oriented to person, place, and time.  Skin: Skin is warm.    ED Course  Procedures (including critical care time)  Labs Reviewed  BASIC METABOLIC PANEL - Abnormal; Notable for the following:    Glucose, Bld 101 (*)    All other components within normal limits  PRO B NATRIURETIC PEPTIDE - Abnormal; Notable for the  following:    Pro B Natriuretic peptide (BNP) 153.8 (*)    All other components within normal limits  PROTIME-INR  CBC  POCT I-STAT TROPONIN I   Dg Chest 2 View  01/25/2013   *RADIOLOGY REPORT*  Clinical Data: Chest pain.  Shortness of breath.  CHEST - 2 VIEW  Comparison: 01/02/2012 and 10/06/2010  Findings: The heart size and pulmonary vascularity are normal and the lungs are clear.  No osseous abnormality. Coronary artery stents noted.  IMPRESSION: No acute abnormality.   Original Report Authenticated By: Francene Boyers, M.D.     No diagnosis found.    MDM   Date: 01/25/2013  Rate: 84  Rhythm: normal sinus rhythm  QRS Axis: normal  Intervals: normal  ST/T Wave abnormalities: nonspecific ST/T changes  Conduction Disutrbances:none  Narrative Interpretation:   Old EKG Reviewed: changes noted - PVC, q waves in lead 3  Differential diagnosis includes: ACS syndrome CHF exacerbation Valvular disorder Myocarditis Pericarditis Pericardial effusion Pneumonia Pleural effusion Pulmonary edema PE Anemia Musculoskeletal pain  Pt comes in with cc of chest pain. Has hx of CAD. Story concerning for unstable angina. EKG shows no STEMI. Nitro x 1 given - pain improved. Will start Heparin. [facility] consulted, they will see the patient shortly. Patient will be monitored closely.    Derwood Kaplan, MD 01/25/13 0817  9:49 AM Nitro relieved the pain. Heparin started. Cards agree to admit the patient.  CRITICAL CARE Performed by: Derwood Kaplan   Total critical care time: 31 minutes  Critical care time was exclusive of separately billable procedures and treating other patients.  Critical care was necessary to treat or prevent imminent or life-threatening deterioration.  Critical care was time spent personally by me on the following activities: development of treatment plan with patient and/or surrogate as well as nursing, discussions with consultants, evaluation of  patient's response to treatment, examination of patient, obtaining history from patient or surrogate, ordering and performing treatments and interventions, ordering and review of laboratory studies, ordering and review of radiographic studies, pulse oximetry and re-evaluation of patient's condition.   Derwood Kaplan, MD 01/25/13 [phone]

## 2013-01-25 NOTE — Progress Notes (Signed)
ANTICOAGULATION CONSULT NOTE - Follow up Consult  Pharmacy Consult for heparin Indication: chest pain/ACS  Allergies  Allergen Reactions  . Shrimp (Shellfish Allergy) Anaphylaxis  . Pravastatin Other (See Comments)    Muscle pain and weakness    Patient Measurements: Height: 5\' 10"  (177.8 cm) Weight: 185 lb (83.915 kg) IBW/kg (Calculated) : 73 Heparin Dosing Weight: 84.1kg  Vital Signs: Temp: 97.9 F (36.6 C) (05/21 1500) Temp src: Oral (05/21 1100) BP: 141/94 mmHg (05/21 1500) Pulse Rate: 92 (05/21 1500)  Labs:  Recent Labs  01/25/13 0709 01/25/13 1148 01/25/13 1453  HGB 18.0*  --   --   HCT 47.9  --   --   PLT 176  --   --   APTT  --  171*  --   LABPROT 12.1 13.6  --   INR 0.90 1.05  --   HEPARINUNFRC  --   --  0.95*  CREATININE 0.94 0.91  --   TROPONINI  --  <0.30  --     Estimated Creatinine Clearance: 95.8 ml/min (by C-G formula based on Cr of 0.91).   Medical History: Past Medical History  Diagnosis Date  . Gastro - esophageal reflux   . Hyperlipidemia   . Coronary artery disease     Inferior myocardial infarction-RCA DES, stent 01/02/12, CTO of prox-mid LAD  . Hypertension     Medications:  Infusions:  . sodium chloride    . heparin 1,200 Units/hr (01/25/13 4098)    Assessment: 56 yom with history of CAD with stenting presented to the ED with CP. Started on heparin gtt for anticoagulation. First heparin level is supratherapeutic despite appropriate initial dosing. Will reduce rate.  Goal of Therapy:  Heparin level 0.3-0.7 units/ml Monitor platelets by anticoagulation protocol: Yes   Plan:  Decrease heparin gtt to 950 units/hr and check level in 6 hours.  Verlene Mayer, PharmD, BCPS Pager [phone] 01/25/2013,3:39 PM

## 2013-01-25 NOTE — Progress Notes (Signed)
ANTICOAGULATION CONSULT NOTE  Pharmacy Consult for heparin Indication: chest pain/ACS  Allergies  Allergen Reactions  . Shrimp (Shellfish Allergy) Anaphylaxis  . Pravastatin Other (See Comments)    Muscle pain and weakness    Patient Measurements: Height: 5\' 10"  (177.8 cm) Weight: 185 lb (83.915 kg) IBW/kg (Calculated) : 73 Heparin Dosing Weight: 84.1kg  Vital Signs: Temp: 98 F (36.7 C) (05/21 2100) BP: 104/54 mmHg (05/21 2100) Pulse Rate: 57 (05/21 2100)  Labs:  Recent Labs  01/25/13 0709 01/25/13 1148 01/25/13 1453 01/25/13 1756 01/25/13 2256  HGB 18.0*  --   --   --   --   HCT 47.9  --   --   --   --   PLT 176  --   --   --   --   APTT  --  171*  --   --   --   LABPROT 12.1 13.6  --   --   --   INR 0.90 1.05  --   --   --   HEPARINUNFRC  --   --  0.95*  --  0.60  CREATININE 0.94 0.91  --   --   --   TROPONINI  --  <0.30  --  <0.30  --     Estimated Creatinine Clearance: 95.8 ml/min (by C-G formula based on Cr of 0.91).  Assessment: 57 y.o. male with chest pain for heparin  Goal of Therapy:  Heparin level 0.3-0.7 units/ml Monitor platelets by anticoagulation protocol: Yes   Plan:  Continue Heparin at current rate  Follow-up am labs.  Geannie Risen, PharmD, BCPS  01/25/2013,11:48 PM

## 2013-01-25 NOTE — ED Notes (Signed)
PT. REPORTS MID CHEST PAIN WITH SLIGHT SOB AND NAUSEA ONSET 4 AM THIS MORNING , PT. STATED HISTORY OF CAD / CORONARY STENT . DR. HENRY SMITH IS HIS CARDIOLOGIST.

## 2013-01-25 NOTE — Progress Notes (Signed)
ANTICOAGULATION CONSULT NOTE - Initial Consult  Pharmacy Consult for heparin Indication: chest pain/ACS  Allergies  Allergen Reactions  . Shrimp (Shellfish Allergy) Anaphylaxis  . Pravastatin Other (See Comments)    Muscle pain and weakness    Patient Measurements:   Heparin Dosing Weight: 84.1kg  Vital Signs: Temp: 97.6 F (36.4 C) (05/21 0648) Temp src: Oral (05/21 0648) BP: 162/82 mmHg (05/21 0648) Pulse Rate: 74 (05/21 0648)  Labs:  Recent Labs  01/25/13 0709  HGB 18.0*  HCT 47.9  PLT 176  LABPROT 12.1  INR 0.90  CREATININE 0.94    The CrCl is unknown because both a height and weight (above a minimum accepted value) are required for this calculation.   Medical History: Past Medical History  Diagnosis Date  . Gastro - esophageal reflux   . Hyperlipidemia   . Coronary artery disease   . Hypertension     Medications:  Infusions:  . heparin    . heparin      Assessment: 57 yom with history of CAD with stenting presented to the ED with CP. To start IV heparin infusion. Baseline INR and CBC are WNL and pt is not on any anticoagulants except for ASA PTA. Patients stated height/weight is 70in/185lb.  Goal of Therapy:  Heparin level 0.3-0.7 units/ml Monitor platelets by anticoagulation protocol: Yes   Plan:  1. Heparin bolus 4000 units IV x 1 2. Heparin gtt 1200 units/hr 3. Check a 6 hour heparin level 4. Daily heparin level and CBC  Sheilia Reznick, Drake Leach 01/25/2013,8:09 AM

## 2013-01-26 ENCOUNTER — Encounter (HOSPITAL_COMMUNITY)
Admission: EM | Disposition: A | Discharge status: Home / Self Care | Payer: Self-pay | Source: Home / Self Care | Attending: Interventional Cardiology

## 2013-01-26 HISTORY — PX: LEFT HEART CATHETERIZATION WITH CORONARY ANGIOGRAM: SHX5451

## 2013-01-26 LAB — PROTIME-INR
INR: 0.99 (ref 0.00–1.49)
Prothrombin Time: 13 seconds (ref 11.6–15.2)

## 2013-01-26 LAB — CBC
Hemoglobin: 15.8 g/dL (ref 13.0–17.0)
MCH: 32 pg (ref 26.0–34.0)
MCV: 89.1 fL (ref 78.0–100.0)
Platelets: 151 10*3/uL (ref 150–400)
RBC: 4.94 MIL/uL (ref 4.22–5.81)
WBC: 6.7 10*3/uL (ref 4.0–10.5)

## 2013-01-26 LAB — BASIC METABOLIC PANEL
BUN: 14 mg/dL (ref 6–23)
CO2: 22 mEq/L (ref 19–32)
Chloride: 105 mEq/L (ref 96–112)
GFR calc Af Amer: >90 mL/min (ref >90)
GFR calc non Af Amer: >90 mL/min (ref >90)
Sodium: 140 mEq/L (ref 135–145)

## 2013-01-26 LAB — HEMOGLOBIN A1C
Hgb A1c MFr Bld: 5.1 % (ref <5.7)
Mean Plasma Glucose: 100 mg/dL (ref <117)

## 2013-01-26 LAB — LIPID PANEL: HDL: 51 mg/dL (ref >39)

## 2013-01-26 LAB — HEPARIN LEVEL (UNFRACTIONATED): Heparin Unfractionated: 0.58 IU/mL (ref 0.30–0.70)

## 2013-01-26 SURGERY — LEFT HEART CATHETERIZATION WITH CORONARY ANGIOGRAM
Anesthesia: Moderate Sedation | Laterality: Right

## 2013-01-26 MED ORDER — OXYCODONE-ACETAMINOPHEN 5-325 MG PO TABS: 1.0000 | ORAL_TABLET | ORAL | Status: DC | PRN

## 2013-01-26 MED ORDER — NITROGLYCERIN 1 MG/10 ML FOR IR/CATH LAB
INTRA_ARTERIAL | Status: AC
  Filled 2013-01-26: qty 10

## 2013-01-26 MED ORDER — METHYLPREDNISOLONE SODIUM SUCC 125 MG IJ SOLR
80.0000 mg | Freq: Once | INTRAMUSCULAR | Status: AC
  Administered 2013-01-26: 80 mg via INTRAVENOUS
  Filled 2013-01-26: qty 1.28

## 2013-01-26 MED ORDER — HEPARIN (PORCINE) IN NACL 2-0.9 UNIT/ML-% IJ SOLN
INTRAMUSCULAR | Status: AC
  Filled 2013-01-26: qty 1000

## 2013-01-26 MED ORDER — LIDOCAINE HCL (PF) 1 % IJ SOLN
INTRAMUSCULAR | Status: AC
  Filled 2013-01-26: qty 30

## 2013-01-26 MED ORDER — FENTANYL CITRATE 0.05 MG/ML IJ SOLN
INTRAMUSCULAR | Status: AC
  Filled 2013-01-26: qty 2

## 2013-01-26 MED ORDER — HEPARIN SODIUM (PORCINE) 1000 UNIT/ML IJ SOLN
INTRAMUSCULAR | Status: AC
  Filled 2013-01-26: qty 1

## 2013-01-26 MED ORDER — CLOPIDOGREL BISULFATE 75 MG PO TABS
ORAL_TABLET | ORAL | Status: AC
  Filled 2013-01-26: qty 2

## 2013-01-26 MED ORDER — MIDAZOLAM HCL 2 MG/2ML IJ SOLN
INTRAMUSCULAR | Status: AC
  Filled 2013-01-26: qty 2

## 2013-01-26 MED ORDER — SODIUM CHLORIDE 0.9 % IV SOLN: 1.0000 mL/kg/h | INTRAVENOUS | Status: AC

## 2013-01-26 NOTE — Progress Notes (Signed)
Patient Name: Nicki Gracy Date of Encounter: 01/26/2013    SUBJECTIVE: The chart has been reviewed. No chest pain overnight. Markers are negative.  TELEMETRY:  Normal sinus rhythm: Filed Vitals:   01/25/13 1100 01/25/13 1500 01/25/13 2100 01/26/13 0500  BP: 147/72 141/94 104/54 110/68  Pulse: 67 92 57 55  Temp: 98.1 F (36.7 C) 97.9 F (36.6 C) 98 F (36.7 C) 97.7 F (36.5 C)  TempSrc: Oral     Resp: 17 17 18 18   Height: 5\' 10"  (1.778 m)     Weight: 83.915 kg (185 lb)   80.8 kg (178 lb 2.1 oz)  SpO2: 100% 98% 95% 96%    Intake/Output Summary (Last 24 hours) at 01/26/13 0842 Last data filed at 01/26/13 0700  Gross per 24 hour  Intake    360 ml  Output   1000 ml  Net   -640 ml    LABS: Basic Metabolic Panel:  Recent Labs  16/10/96 1148 01/26/13 0517  NA 139 140  K 3.6 4.0  CL 103 105  CO2 19 22  GLUCOSE 95 102*  BUN 13 14  CREATININE 0.91 0.98  CALCIUM 9.4 8.8   CBC:  Recent Labs  01/25/13 0709 01/26/13 0517  WBC 5.7 6.7  HGB 18.0* 15.8  HCT 47.9 44.0  MCV 87.6 89.1  PLT 176 151   Cardiac Enzymes:  Recent Labs  01/25/13 1148 01/25/13 1756 01/25/13 2256  TROPONINI <0.30 <0.30 <0.30   Hemoglobin A1C:  Recent Labs  01/25/13 1148  HGBA1C 5.1   Fasting Lipid Panel:  Recent Labs  01/26/13 0517  CHOL 196  HDL 51  LDLCALC 120*  TRIG 123  CHOLHDL 3.8    Radiology/Studies:  Unremarkable  Physical Exam: Blood pressure 110/68, pulse 55, temperature 97.7 F (36.5 C), temperature source Oral, resp. rate 18, height 5\' 10"  (1.778 m), weight 80.8 kg (178 lb 2.1 oz), SpO2 96.00%. Weight change:    S4 gallop  Good pulses bilaterally  ASSESSMENT:  1. Prolonged chest pain in a patient with known coronary disease and total occlusion of the LAD. No evidence of myocardial injury   Plan:  1. Plan diagnostic cath later today with further management pending results.  2. History of shellfish allergy. Will probably need  prophylaxis.  Selinda Eon 01/26/2013, 8:42 AM

## 2013-01-26 NOTE — CV Procedure (Signed)
Diagnostic Cardiac Catheterization Report  Charles Benjamin  57 y.o.  male 1956-01-09  Procedure Date: 01/26/2013 Referring Physician: Loma Sender, MD Primary Cardiologist:: HWBSmith, III, MD   PROCEDURE:  Left heart catheterization with selective coronary angiography, left ventriculogram.  INDICATIONS:  Prolonged chest pain in a patient who is status post acute inferior wall ST elevation MI treated with drug-eluting stent in April 2013. He presents on this occasion with recurrent chest pressure atypical in nature and occurring with increased frequency over the past 2 weeks. In addition to the right coronary stent the patient is known to have a short segment LAD total occlusion that we have contemplated treating but the patient has been unwilling to proceed because of lack of insurance. No recurrent chest pain since admission. In the cath lab he is noted to have frequent short runs of SVT the these are asymptomatic cardiac markers and EKGs are unremarkable. Catheterization is being done to rule out right coronary restenosis, progression of disease, and otherwise help explain the patient's current presentation  The risks, benefits, and details of the procedure were explained to the patient.  The patient verbalized understanding and wanted to proceed.  Informed written consent was obtained.  PROCEDURE TECHNIQUE:  After Xylocaine anesthesia a  5 French sheath was placed in the right femoral artery with a single anterior needle wall stick.   Coronary angiography was done using a 5 [place] A2 MP and 5 [place] JR 4 catheter.  Left ventriculography was done using a 5 [place] A2 MP catheter by hand injection.   After reviewing the digital images the only abnormality is the previously noted total occlusion of the LAD. This vessel is collateralized by left to left homocollaterals and also right to left septal collaterals. The segment is relatively short period. There is "a bird's beak" tapered  occlusion.  After discussing with the patient, and per hour prior plans he felt that an attempt at recanalization of the LAD is reasonable.  We upgraded to a 6 French sheath and used an XB LAD 3.5 cm guide catheter. A total of 6100 units of heparin was administered and ACT was documented to be greater than 260. We then used a run through guidewire and made multiple passes at crossing the total occlusion. Multiple small nearby  homocollaterals were selected by the guidewire on many of the passes. Rather than escalate the procedure, I decided to abort and will discuss further options for management of the total occlusion including continued medical therapy versus referral to Dr. Eldridge Dace for consideration of the CrossBoss total occlusion device. This will be dependent upon the patient's clinical status.  Angio-Seal was deployed with good hemostasis.  CONTRAST:  Total of 170 cc.  COMPLICATIONS:  None.    HEMODYNAMICS:  Aortic pressure was 118/78 mmHg; LV pressure was 121/2 mmHg; LVEDP 12 mm mercury.  There was no gradient between the left ventricle and aorta.    ANGIOGRAPHIC DATA:   The left main coronary artery is anatomically absent with the patient having separate ostia for the LAD and circumflex..  The left anterior descending artery is total occlusion of a large first septal perforator and diagonal. The occlusion and is in a bird's beak. There are small homocollaterals noted. No significant obstruction is noted proximal to the total occlusion in the proximal to mid LAD.  The left circumflex artery is large with a bifurcating first obtuse marginal and 3 smaller or distal obtuse marginals. The circumflex beyond the first obtuse marginal contains a  relatively focal 50% narrowing .Marland Kitchen  The right coronary artery is dominant and widely patent. The mid vessel drug-eluting stent is widely patent. The distal vessel before the PDA and sequential fashion contains 50% stenoses. No high-grade obstruction is  noted.Marland Kitchen  LEFT VENTRICULOGRAM:  Left ventricular angiogram was done in the 30 RAO projection and revealed mild inferior wall motion abnormality and normal overall systolic function with estimated ejection fraction of 60%.    PCI RESULTS: The total occlusion in the mid LAD was probed with a RunThrough wire. The wire tended to select homocollaterals. We will unable to obtain purchase within the lesion in any microchannel that appeared promising. After multiple attempts to cross we terminated the case.  IMPRESSIONS:  1. Widely patent right coronary without evidence of in-stent restenosis or progression of disease. The circumflex artery contains a 50% mid vessel stenosis .  2. 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 The patient's current symptoms could be related to anterior wall ischemia although the LAD appears to have reasonable collateralization.  3. Inability to cross the total occlusion with guidewire as noted above.  4. Overall normal left ventricular function with EF of 60%  5. Nonsustained SVT, asymptomatic while in cath lab.   RECOMMENDATION:  1. Resume medical therapy  2. Consider 30 day ambulatory monitoring rule out atrial fibrillation. Perhaps the patient had a nonsustained episode of atrial fibrillation that accounts for his current symptoms .

## 2013-01-26 NOTE — Progress Notes (Signed)
Utilization review completed.  

## 2013-01-27 DIAGNOSIS — I471 Supraventricular tachycardia, unspecified: Secondary | ICD-10-CM | POA: Diagnosis present

## 2013-01-27 DIAGNOSIS — I2510 Atherosclerotic heart disease of native coronary artery without angina pectoris: Secondary | ICD-10-CM | POA: Diagnosis present

## 2013-01-27 LAB — CBC
HCT: 46.6 % (ref 39.0–52.0)
Hemoglobin: 16.8 g/dL (ref 13.0–17.0)
MCHC: 36.1 g/dL — ABNORMAL HIGH (ref 30.0–36.0)
MCV: 89.4 fL (ref 78.0–100.0)
RDW: 12.1 % (ref 11.5–15.5)
WBC: 17.5 10*3/uL — ABNORMAL HIGH (ref 4.0–10.5)

## 2013-01-27 LAB — MAGNESIUM: Magnesium: 1.9 mg/dL (ref 1.5–2.5)

## 2013-01-27 LAB — TSH: TSH: 0.426 u[IU]/mL (ref 0.350–4.500)

## 2013-01-27 LAB — POTASSIUM: Potassium: 4.2 mEq/L (ref 3.5–5.1)

## 2013-01-27 MED ORDER — METOPROLOL SUCCINATE ER 50 MG PO TB24: 50.0000 mg | ORAL_TABLET | Freq: Every day | ORAL | Status: DC

## 2013-01-27 MED ORDER — METOPROLOL TARTRATE 1 MG/ML IV SOLN
INTRAVENOUS | Status: AC
  Administered 2013-01-27: 5 mg
  Filled 2013-01-27: qty 5

## 2013-01-27 MED ORDER — METOPROLOL TARTRATE 1 MG/ML IV SOLN
INTRAVENOUS | Status: AC
  Administered 2013-01-27: 5 mg
  Filled 2013-01-27: qty 10

## 2013-01-27 MED ORDER — METOPROLOL TARTRATE 1 MG/ML IV SOLN
5.0000 mg | INTRAVENOUS | Status: DC | PRN
  Administered 2013-01-27: 5 mg via INTRAVENOUS

## 2013-01-27 MED ORDER — OFF THE BEAT BOOK
Freq: Once | Status: AC
Start: 2013-01-27 — End: 2013-01-27
  Administered 2013-01-27: 03:00:00
  Filled 2013-01-27: qty 1

## 2013-01-27 MED ORDER — NITROGLYCERIN 0.4 MG SL SUBL: 0.4000 mg | SUBLINGUAL_TABLET | SUBLINGUAL | Status: AC | PRN

## 2013-01-27 MED ORDER — SIMVASTATIN 20 MG PO TABS: 20.0000 mg | ORAL_TABLET | Freq: Every day | ORAL | Status: DC

## 2013-01-27 MED ORDER — CLOPIDOGREL BISULFATE 75 MG PO TABS
75.0000 mg | ORAL_TABLET | Freq: Every day | ORAL | Status: DC
  Administered 2013-01-27: 75 mg via ORAL
  Filled 2013-01-27: qty 1

## 2013-01-27 MED ORDER — METOPROLOL TARTRATE 1 MG/ML IV SOLN: 5.0000 mg | INTRAVENOUS | Status: DC

## 2013-01-27 MED ORDER — METOPROLOL SUCCINATE ER 50 MG PO TB24
50.0000 mg | ORAL_TABLET | Freq: Every day | ORAL | Status: DC
  Administered 2013-01-27: 09:00:00 50 mg via ORAL
  Filled 2013-01-27 (×2): qty 1

## 2013-01-27 MED ORDER — ALPRAZOLAM 0.25 MG PO TABS
0.2500 mg | ORAL_TABLET | Freq: Once | ORAL | Status: AC
  Administered 2013-01-27: 0.25 mg via ORAL
  Filled 2013-01-27: qty 1

## 2013-01-27 MED ORDER — CLOPIDOGREL BISULFATE 75 MG PO TABS: 75.0000 mg | ORAL_TABLET | Freq: Every day | ORAL | Status: AC

## 2013-01-27 NOTE — Progress Notes (Signed)
Night [facility] note Called due to elevated heart rate. On telemetry, increased atrial ectopy. Noted several different P wave morphologies. Rates averaging 100 but frequent bursts up to 150s. Given 5 mg IV metoprolol x 3 without significant benefit.   Consider multi-focal atrial tach (though no history of lung disease) vs. Intermittent atrial fibrillation. Normal K this AM. TSH wnl a year ago. Will add repeat to AM labs.  Team to consider echo in the AM to complete evaluation.

## 2013-01-27 NOTE — Discharge Summary (Signed)
Patient ID: Charles Benjamin MRN: [id number] DOB/AGE: Dec 06, 1955 57 y.o.  Admit date: 01/25/2013 Discharge date: 01/27/2013  Primary Discharge Diagnosis : Intermediate coronary syndrome resolved. No MI documented.  Secondary Discharge Diagnosis: Coronary artery disease with total occlusion of of mid LAD with collaterals. Patent RCA stent by cath  Paroxysmal atrial tachycardia. No documented atrial fibrillation yet  Hyperlipidemia  Significant Diagnostic Studies: Coronary angiography and unsuccessful PCI LAD CTO, 01/27/12  Consults: None  Hospital Course:   He was admitted with story compatible with ACS. He had negative markers and was also noted to have frequent non sustained SVT/MAT. No atrial fibrillation has been identified. He was asymptomatic with the brief SVT episodes with no palpitations or chest pain.  He underwent cath and was documented to have wide patency of the previously stented RCA. The LAD was occluded in the mid segment as before. After consideration, we felt the LAD territory could be the source of angina if he has prolonged episodes of SVT. For this reason we probed the LAD with a wire but were unable to cross. We had discussed CTO PCI last year but he never returned. Plan now is to further investigate the SVT with 30 day telemetry, increase beta blocker intensity, and consider CTO PCI with CrossBoss if angina continues without tachycardia as the cause.   Discharge Exam: Blood pressure 150/97, pulse 101, temperature 98 F (36.7 C), temperature source Oral, resp. rate 16, height 5\' 10"  (1.778 m), weight 82.7 kg (182 lb 5.1 oz), SpO2 98.00%.   Telemetry with frequent atrial tach, PSVT and MAT. No a fib seen. Cath site unremarkable. Chest clear. Cardiac without rub  Labs:   Lab Results  Component Value Date   WBC 17.5* 01/27/2013   HGB 16.8 01/27/2013   HCT 46.6 01/27/2013   MCV 89.4 01/27/2013   PLT 167 01/27/2013    Recent Labs Lab 01/25/13 1148  01/26/13 0517 01/27/13 0400  NA 139 140  --   K 3.6 4.0 4.2  CL 103 105  --   CO2 19 22  --   BUN 13 14  --   CREATININE 0.91 0.98  --   CALCIUM 9.4 8.8  --   PROT 6.7  --   --   BILITOT 0.7  --   --   ALKPHOS 61  --   --   ALT 29  --   --   AST 28  --   --   GLUCOSE 95 102*  --    Lab Results  Component Value Date   CKTOTAL 846* 01/03/2012   CKMB 64.0* 01/03/2012   TROPONINI <0.30 01/25/2013    Lab Results  Component Value Date   CHOL 196 01/26/2013   CHOL 172 01/03/2012   Lab Results  Component Value Date   HDL 51 01/26/2013   HDL 54 7/82/9562   Lab Results  Component Value Date   LDLCALC 120* 01/26/2013   LDLCALC 97 01/03/2012   Lab Results  Component Value Date   TRIG 123 01/26/2013   TRIG 104 01/03/2012   Lab Results  Component Value Date   CHOLHDL 3.8 01/26/2013   CHOLHDL 3.2 01/03/2012   No results found for this basename: LDLDIRECT      Radiology: No acute abnormality. EKG: No acute abnormality and PAC's  FOLLOW UP PLANS AND APPOINTMENTS    Medication List    STOP taking these medications       pravastatin 40 MG tablet  Commonly known  as:  PRAVACHOL  Replaced by:  simvastatin 20 MG tablet      TAKE these medications       aspirin 325 MG EC tablet  Take 325 mg by mouth daily.     bismuth subsalicylate 262 MG chewable tablet  Commonly known as:  PEPTO BISMOL  Chew 524 mg by mouth as needed for indigestion or heartburn.     metoprolol succinate 50 MG 24 hr tablet  Commonly known as:  TOPROL-XL  Take 1 tablet (50 mg total) by mouth daily. Take with or immediately following a meal.     nitroGLYCERIN 0.4 MG SL tablet  Commonly known as:  NITROSTAT  Place 1 tablet (0.4 mg total) under the tongue every 5 (five) minutes x 3 doses as needed for chest pain.     simvastatin 20 MG tablet  Commonly known as:  ZOCOR  Take 1 tablet (20 mg total) by mouth daily at 6 PM.           Follow-up Information   Follow up with Lesleigh Noe, MD On  02/15/2013. (11:45 AM)    Contact information:   [address] [place] [address] [phone]       BRING ALL MEDICATIONS WITH YOU TO FOLLOW UP APPOINTMENTS  Time spent with patient to include physician time: 30 minutes Signed: Lesleigh Noe 01/27/2013, 8:18 AM

## 2013-04-03 ENCOUNTER — Encounter (HOSPITAL_COMMUNITY): Payer: Self-pay | Admitting: Emergency Medicine

## 2013-04-03 ENCOUNTER — Emergency Department (HOSPITAL_COMMUNITY)
Admission: EM | Admit: 2013-04-03 | Discharge: 2013-04-03 | Disposition: A | Discharge status: Home / Self Care | Payer: No Typology Code available for payment source | Attending: Emergency Medicine | Admitting: Emergency Medicine

## 2013-04-03 ENCOUNTER — Emergency Department (HOSPITAL_COMMUNITY): Payer: No Typology Code available for payment source

## 2013-04-03 DIAGNOSIS — E7800 Pure hypercholesterolemia, unspecified: Secondary | ICD-10-CM | POA: Insufficient documentation

## 2013-04-03 DIAGNOSIS — Z7982 Long term (current) use of aspirin: Secondary | ICD-10-CM | POA: Insufficient documentation

## 2013-04-03 DIAGNOSIS — Z8719 Personal history of other diseases of the digestive system: Secondary | ICD-10-CM | POA: Insufficient documentation

## 2013-04-03 DIAGNOSIS — H81399 Other peripheral vertigo, unspecified ear: Secondary | ICD-10-CM | POA: Insufficient documentation

## 2013-04-03 DIAGNOSIS — I10 Essential (primary) hypertension: Secondary | ICD-10-CM | POA: Insufficient documentation

## 2013-04-03 DIAGNOSIS — I2510 Atherosclerotic heart disease of native coronary artery without angina pectoris: Secondary | ICD-10-CM | POA: Insufficient documentation

## 2013-04-03 DIAGNOSIS — Z9861 Coronary angioplasty status: Secondary | ICD-10-CM | POA: Insufficient documentation

## 2013-04-03 DIAGNOSIS — Z7902 Long term (current) use of antithrombotics/antiplatelets: Secondary | ICD-10-CM | POA: Insufficient documentation

## 2013-04-03 DIAGNOSIS — Z79899 Other long term (current) drug therapy: Secondary | ICD-10-CM | POA: Insufficient documentation

## 2013-04-03 DIAGNOSIS — R079 Chest pain, unspecified: Secondary | ICD-10-CM | POA: Insufficient documentation

## 2013-04-03 DIAGNOSIS — E785 Hyperlipidemia, unspecified: Secondary | ICD-10-CM | POA: Insufficient documentation

## 2013-04-03 LAB — BASIC METABOLIC PANEL
CO2: 21 mEq/L (ref 19–32)
Glucose, Bld: 87 mg/dL (ref 70–99)
Potassium: 4.1 mEq/L (ref 3.5–5.1)
Sodium: 138 mEq/L (ref 135–145)

## 2013-04-03 LAB — CBC WITH DIFFERENTIAL/PLATELET
Basophils Absolute: 0.1 10*3/uL (ref 0.0–0.1)
Lymphocytes Relative: 21 % (ref 12–46)
Lymphs Abs: 1.9 10*3/uL (ref 0.7–4.0)
Neutro Abs: 6.4 10*3/uL (ref 1.7–7.7)
Neutrophils Relative %: 69 % (ref 43–77)
Platelets: 175 10*3/uL (ref 150–400)
RBC: 5.28 MIL/uL (ref 4.22–5.81)
WBC: 9.2 10*3/uL (ref 4.0–10.5)

## 2013-04-03 LAB — POCT I-STAT TROPONIN I
Troponin i, poc: 0.03 ng/mL (ref 0.00–0.08)
Troponin i, poc: 0 ng/mL (ref 0.00–0.08)
Troponin i, poc: 0.01 ng/mL (ref 0.00–0.08)

## 2013-04-03 MED ORDER — MECLIZINE HCL 50 MG PO TABS: 50.0000 mg | ORAL_TABLET | Freq: Three times a day (TID) | ORAL | Status: DC | PRN

## 2013-04-03 NOTE — ED Notes (Signed)
Pt discharged.Vital signs stable and GCS 15 

## 2013-04-03 NOTE — ED Provider Notes (Signed)
CSN: [id number]     Arrival date & time 04/03/13  4098 History     First MD Initiated Contact with Patient 04/03/13 [phone]     Chief Complaint  Patient presents with  . Chest Pain   (Consider location/radiation/quality/duration/timing/severity/associated sxs/prior Treatment) Patient is a 57 y.o. male presenting with chest pain. The history is provided by the patient.  Chest Pain Pain location:  Substernal area Pain quality: aching, pressure and tightness   Pain radiates to:  Does not radiate Pain radiates to the back: no   Pain severity:  Moderate Onset quality:  Gradual Duration:  1 hour Timing:  Constant Progression:  Resolved Chronicity:  Recurrent Context comment:  Started after he got up this morning and worsened when he got to work Relieved by:  Nitroglycerin Worsened by:  Nothing tried Ineffective treatments:  None tried Associated symptoms: dizziness   Associated symptoms: no abdominal pain, no back pain, no cough, no fatigue, no fever, no headache, no lower extremity edema, no nausea, no near-syncope, no palpitations, no shortness of breath, not vomiting and no weakness   Risk factors: coronary artery disease, high cholesterol and hypertension   Risk factors: no diabetes mellitus and no smoking     Past Medical History  Diagnosis Date  . Gastro - esophageal reflux   . Hyperlipidemia   . Coronary artery disease     Inferior myocardial infarction-RCA DES, stent 01/02/12, CTO of prox-mid LAD  . Hypertension    Past Surgical History  Procedure Laterality Date  . Coronary stent placement     No family history on file. History  Substance Use Topics  . Smoking status: Never Smoker   . Smokeless tobacco: Not on file  . Alcohol Use: No    Review of Systems  Constitutional: Negative for fever and fatigue.  Respiratory: Negative for cough and shortness of breath.   Cardiovascular: Positive for chest pain. Negative for palpitations and near-syncope.   Gastrointestinal: Negative for nausea, vomiting and abdominal pain.  Musculoskeletal: Negative for back pain.  Neurological: Positive for dizziness. Negative for weakness and headaches.  All other systems reviewed and are negative.    Allergies  Shrimp and Pravastatin  Home Medications   Current Outpatient Rx  Name  Route  Sig  Dispense  Refill  . aspirin 325 MG EC tablet   Oral   Take 325 mg by mouth daily.         Marland Kitchen bismuth subsalicylate (PEPTO BISMOL) 262 MG chewable tablet   Oral   Chew 524 mg by mouth as needed for indigestion or heartburn.         . clopidogrel (PLAVIX) 75 MG tablet   Oral   Take 1 tablet (75 mg total) by mouth daily with breakfast.         . metoprolol succinate (TOPROL-XL) 50 MG 24 hr tablet   Oral   Take 1 tablet (50 mg total) by mouth daily. Take with or immediately following a meal.   30 tablet   11   . nitroGLYCERIN (NITROSTAT) 0.4 MG SL tablet   Sublingual   Place 1 tablet (0.4 mg total) under the tongue every 5 (five) minutes x 3 doses as needed for chest pain.   25 tablet   5   . simvastatin (ZOCOR) 20 MG tablet   Oral   Take 1 tablet (20 mg total) by mouth daily at 6 PM.   30 tablet   11    BP 155/102  Pulse 71  Temp(Src) 98.1 F (36.7 C) (Oral)  Resp 16  SpO2 96% Physical Exam  Nursing note and vitals reviewed. Constitutional: He is oriented to person, place, and time. He appears well-developed and well-nourished. No distress.  HENT:  Head: Normocephalic and atraumatic.  Mouth/Throat: Oropharynx is clear and moist.  Eyes: Conjunctivae and EOM are normal. Pupils are equal, round, and reactive to light.  Neck: Normal range of motion. Neck supple.  Cardiovascular: Normal rate, regular rhythm and intact distal pulses.   No murmur heard. Pulmonary/Chest: Effort normal and breath sounds normal. No respiratory distress. He has no wheezes. He has no rales.  Abdominal: Soft. He exhibits no distension. There is no  tenderness. There is no rebound and no guarding.  Musculoskeletal: Normal range of motion. He exhibits no edema and no tenderness.  Neurological: He is alert and oriented to person, place, and time. He has normal strength. No cranial nerve deficit or sensory deficit.  Skin: Skin is warm and dry. No rash noted. No erythema.  Psychiatric: He has a normal mood and affect. His behavior is normal.    ED Course   Procedures (including critical care time)  Labs Reviewed  CBC WITH DIFFERENTIAL - Abnormal; Notable for the following:    Hemoglobin 17.9 (*)    All other components within normal limits  BASIC METABOLIC PANEL  POCT I-STAT TROPONIN I  POCT I-STAT TROPONIN I  POCT I-STAT TROPONIN I    Date: 04/03/2013  Rate: 102  Rhythm: sinus tachycardia and with irregular rate  QRS Axis: normal  Intervals: QT prolonged  ST/T Wave abnormalities: nonspecific T wave changes  Conduction Disutrbances:none  Narrative Interpretation:   Old EKG Reviewed: unchanged   Mr Brain Wo Contrast  04/03/2013   *RADIOLOGY REPORT*  Clinical Data: Dizziness  MRI HEAD WITHOUT CONTRAST  Technique:  Multiplanar, multiecho pulse sequences of the brain and surrounding structures were obtained according to standard protocol without intravenous contrast.  Comparison: None.  Findings: Negative for acute infarct.  Scattered small white matter hyperintensities bilaterally appear chronic and most likely related to chronic microvascular ischemia. The brainstem and cerebellum are normal.  Negative for hemorrhage or mass lesion.  Ventricle size is normal. No midline shift.  Small amount of mucosal edema in the maxillary sinuses.  No air- fluid level.  Mastoid sinus is clear bilaterally.  IMPRESSION: Mild chronic microvascular ischemic change in the white matter.  No acute infarct.   Original Report Authenticated By: Janeece Riggers, M.D.   Dg Chest Port 1 View  04/03/2013   *RADIOLOGY REPORT*  Clinical Data: Chest pain, history of  MI  PORTABLE CHEST - 1 VIEW  Comparison: 01/25/2013  Findings: Cardiomediastinal silhouette is stable.  Mild thoracic dextroscoliosis.  No acute infiltrate or pleural effusion.  No pulmonary edema.  IMPRESSION: No active disease.  Mild thoracic dextroscoliosis.   Original Report Authenticated By: Natasha Mead, M.D.   No diagnosis found.  MDM   Patient with 2 separate issues. His first is complaint of a lightheaded/dizzy feeling that's been going on for 2 weeks and saw ENT last week and had his ears cleaned out but states the symptoms have not improved. He does not know of anything that makes it better or worse but states it does make him mildly nauseated. He denies head trauma, fever or vomiting. He denies any numbness or weakness or gait problems.  On exam patient has no signs of otitis media symptoms are not reproduced by movement of the eyes or head. Patient denies  any allergies or recent URI.  We'll do orthostatics to insure its blood pressure drop her heart rate issues causing patient to have these sensations. Unclear if etiology of his dizziness at this time.  May need an MRI for further evaluation to rule out stroke as the cause of patient's symptoms.  Secondly today patient developed chest tightness after waking which was localized in the substernal area. It worsened when he got to work he had no associated symptoms and no radiation of the pain. The pain completely resolved after nitroglycerin and aspirin. He states he's had nitroglycerin at home for a long time but today was the first time he used it.  EKG shows a sinus tachycardia with an irregular rate which is unchanged from his prior EKG. Patient was admitted in May of this year and had a catheterization which showed patent stent of the RCA but LAD disease not ammenable to stent. At that time his metoprolol was increased to 50 mg daily.  Chest x-ray, CBC, BMP and i-STAT troponin pending.    3:49 PM Patient's MRI was negative for acute stroke.  Feel dizziness is related to it or ear issues. We'll treat with meclizine. 3 troponins were negative and spoke with Dr.Skains and he felt with 3 negative troponins and given patient's story it was reasonable to have him followup this week with Dr. Katrinka Blazing prior to starting any new medications. Patient has aspirin and nitroglycerin to use at home if pain recurs and is to return immediately. Patient has remained chest pain-free and has unchanged EKG. We'll discharge home  Gwyneth Sprout, MD 04/03/13 1550

## 2013-04-03 NOTE — ED Notes (Signed)
Chest tightness x 2 weeks worsening at work this morning. Pt took 1 nitro at work and  Denies any issues at this time. EMS gave 325 aspirin.

## 2013-04-03 NOTE — ED Notes (Signed)
PT reports he has been stressed recently; mother is in ICU here at this time.

## 2013-05-17 ENCOUNTER — Ambulatory Visit: Payer: No Typology Code available for payment source | Admitting: Neurology

## 2013-07-10 ENCOUNTER — Encounter: Payer: Self-pay | Admitting: Interventional Cardiology

## 2013-09-19 ENCOUNTER — Ambulatory Visit: Payer: No Typology Code available for payment source | Admitting: Interventional Cardiology

## 2013-09-28 ENCOUNTER — Encounter: Payer: Self-pay | Admitting: Interventional Cardiology

## 2013-11-07 ENCOUNTER — Other Ambulatory Visit: Payer: Self-pay

## 2013-11-09 ENCOUNTER — Other Ambulatory Visit: Payer: Self-pay

## 2013-11-09 MED ORDER — METOPROLOL SUCCINATE ER 50 MG PO TB24: 50.0000 mg | ORAL_TABLET | Freq: Every day | ORAL | Status: DC

## 2014-07-23 IMAGING — CR DG CHEST 2V
2 series · 2 of 2 positions shown · non-contrast
Comparison: 01/02/2012 and 10/06/2010

CLINICAL DATA: Chest pain.  Shortness of breath.

CHEST - 2 VIEW

[w chest pa]
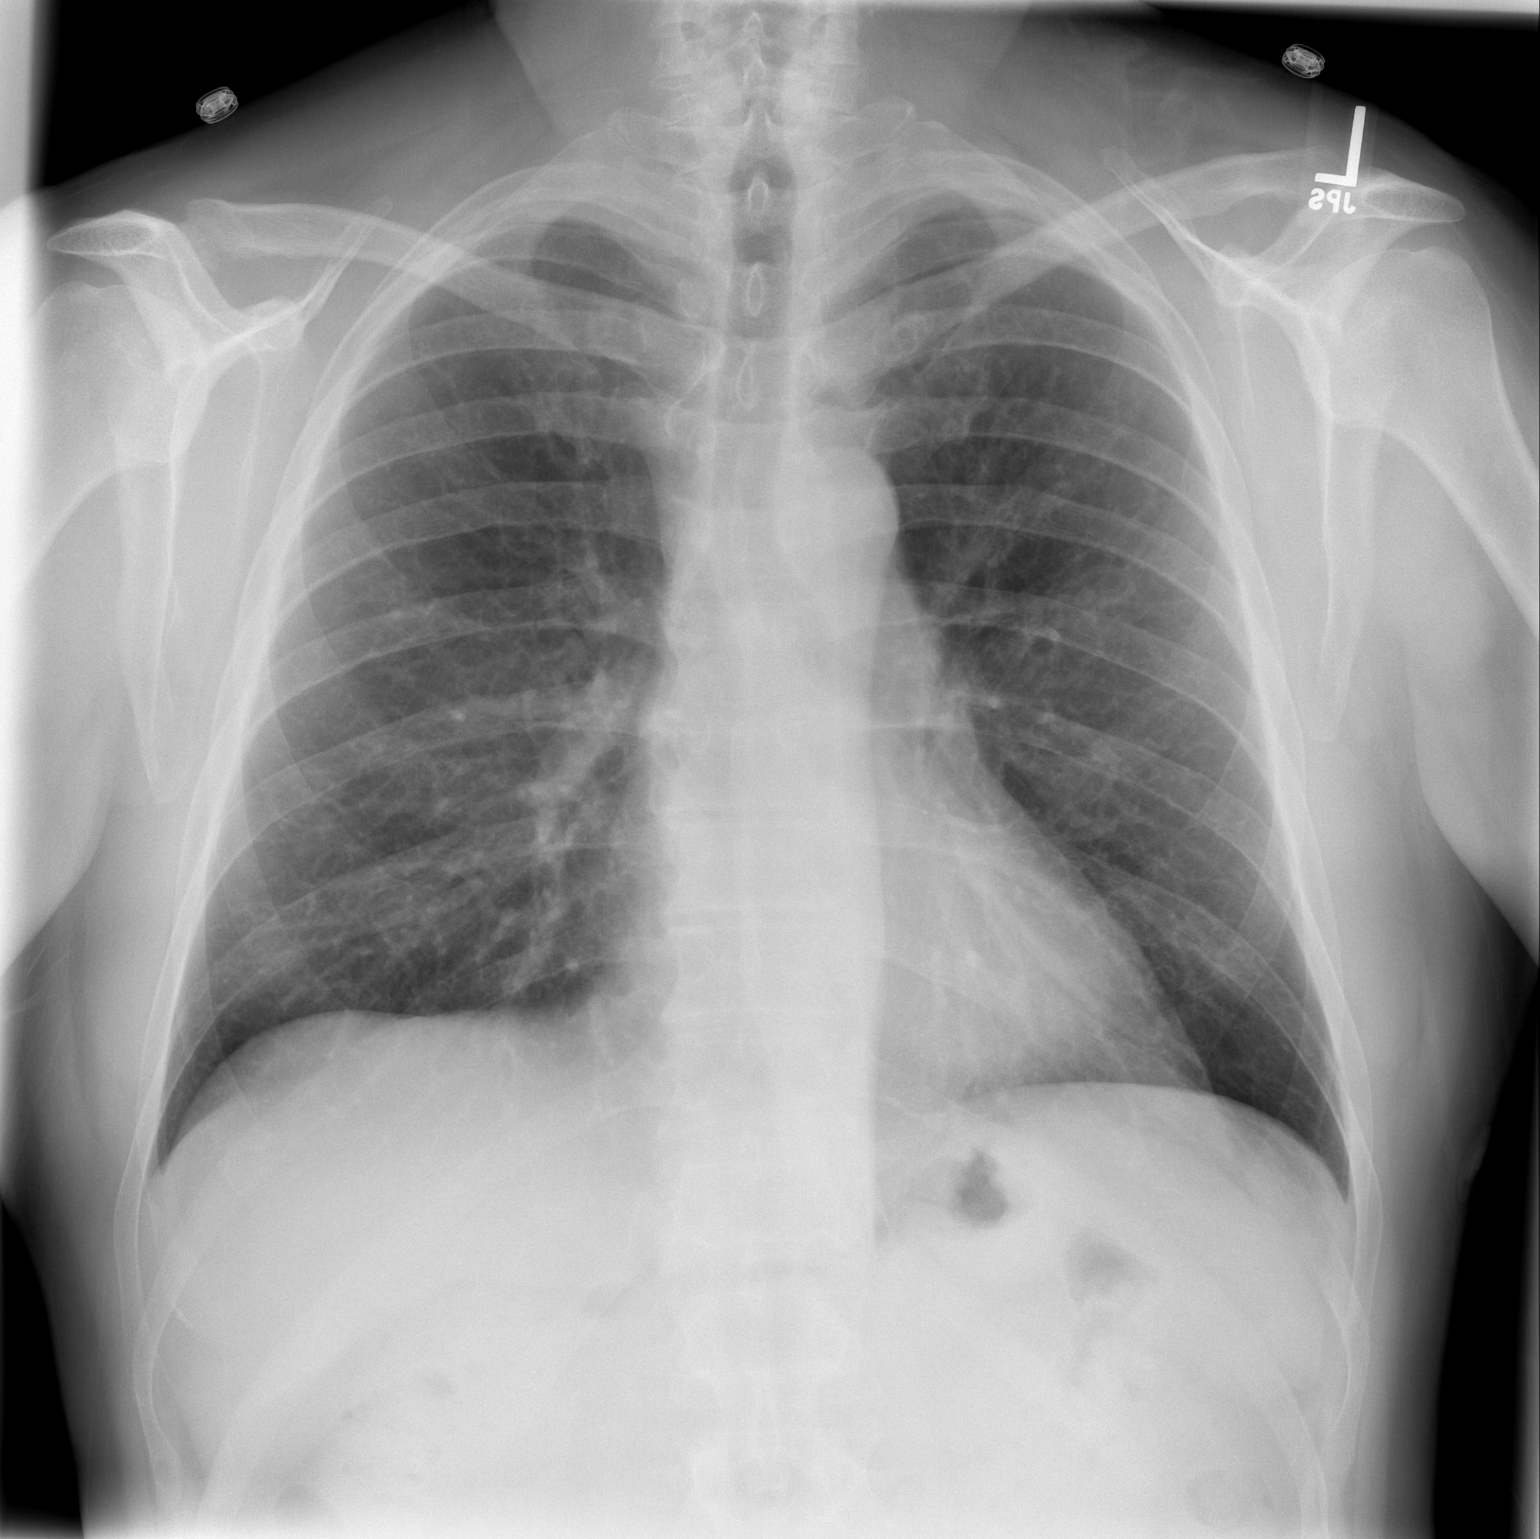

[w chest lat]
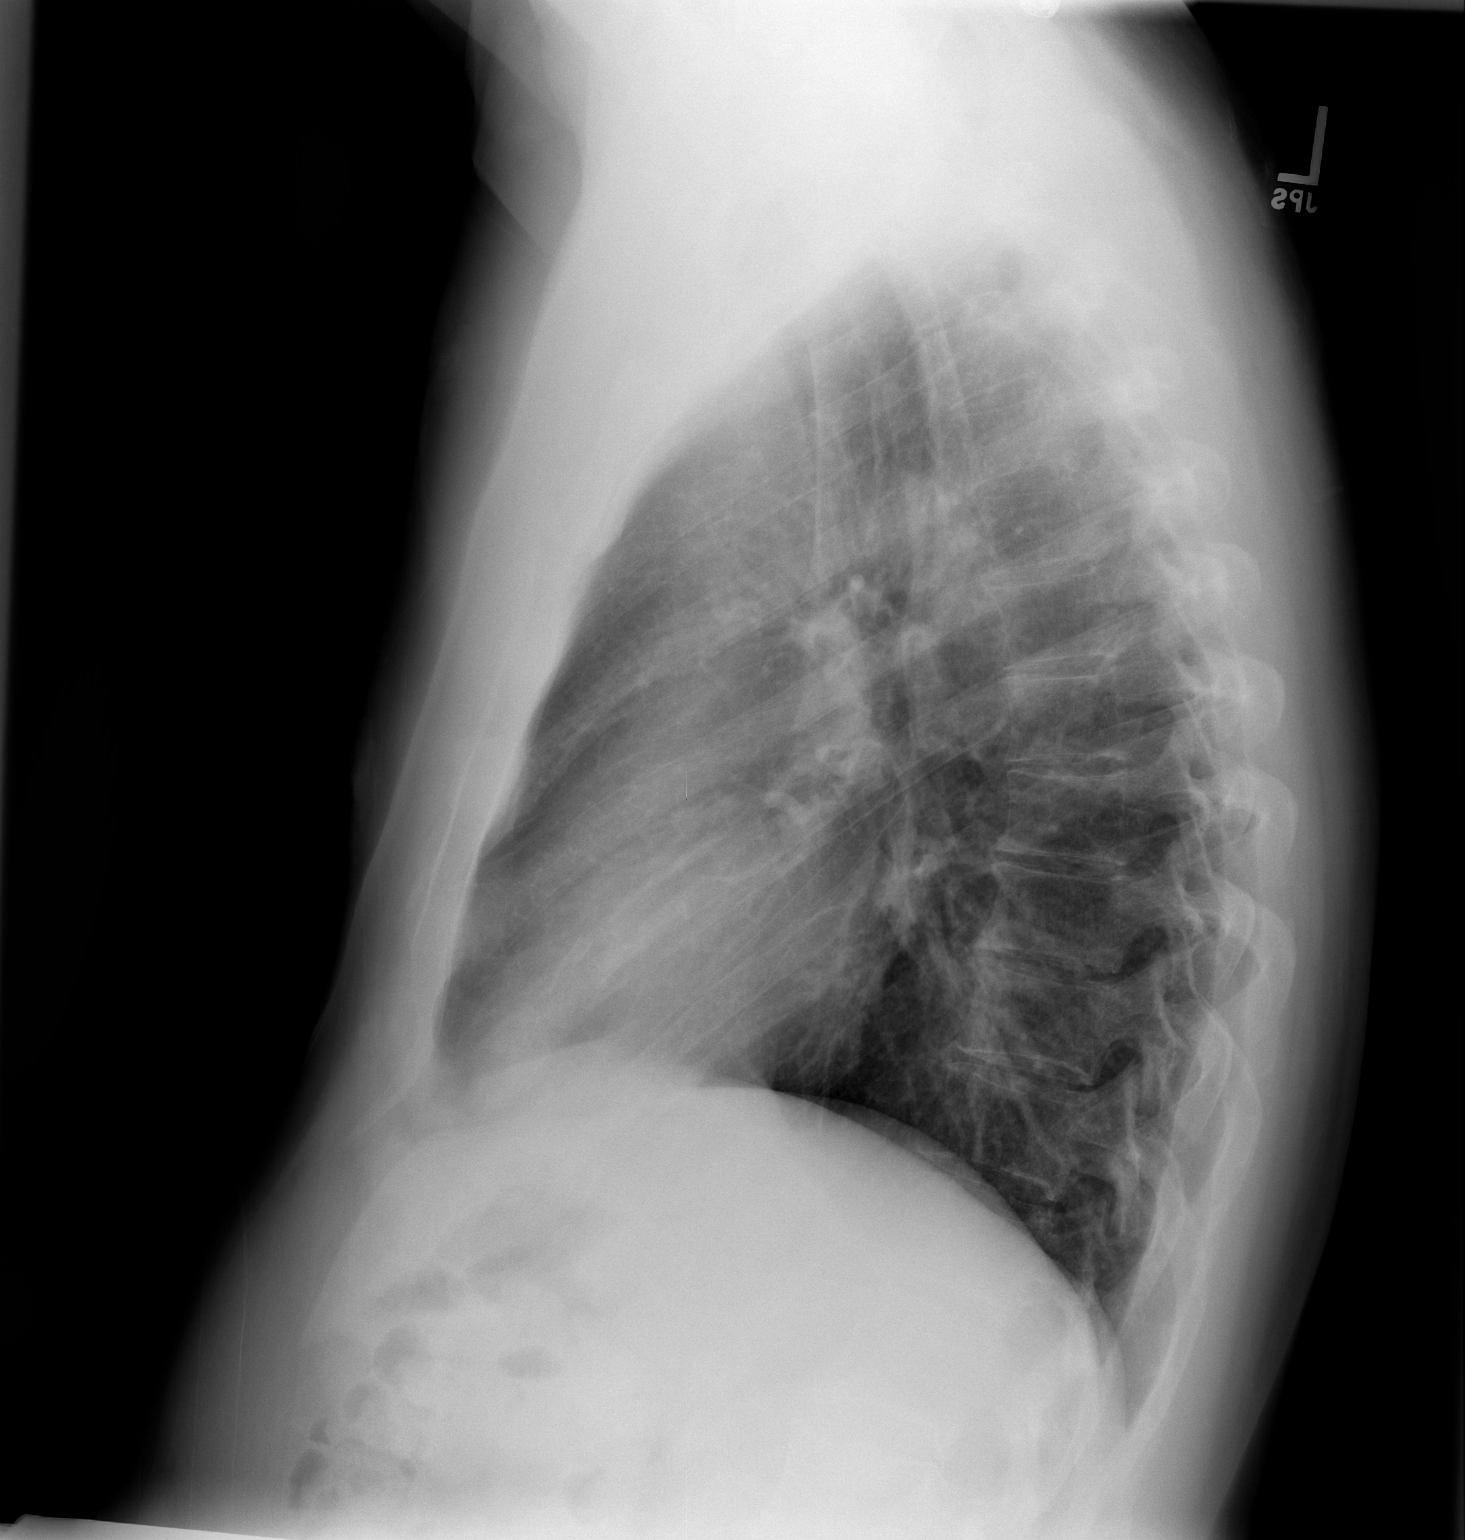

[2 of 2 positions shown; findings below may reference images not displayed]

FINDINGS: The heart size and pulmonary vascularity are normal and
the lungs are clear.  No osseous abnormality. Coronary artery
stents noted.
IMPRESSION: No acute abnormality.

## 2014-08-16 ENCOUNTER — Encounter (HOSPITAL_COMMUNITY): Payer: Self-pay | Admitting: Interventional Cardiology

## 2014-12-02 ENCOUNTER — Emergency Department: Payer: Self-pay | Admitting: Emergency Medicine

## 2014-12-02 LAB — TROPONIN I: Troponin-I: <0.03 ng/mL

## 2014-12-02 LAB — BASIC METABOLIC PANEL
Anion Gap: 8 (ref 7–16)
BUN: 14 mg/dL
CHLORIDE: 104 mmol/L
CREATININE: 0.97 mg/dL
Calcium, Total: 9 mg/dL
Co2: 26 mmol/L
EGFR (African American): >60
Glucose: 165 mg/dL — ABNORMAL HIGH
Potassium: 4.2 mmol/L
Sodium: 138 mmol/L

## 2014-12-02 LAB — CBC
HCT: 50.6 % (ref 40.0–52.0)
HGB: 17.7 g/dL (ref 13.0–18.0)
MCH: 33.6 pg (ref 26.0–34.0)
MCHC: 35 g/dL (ref 32.0–36.0)
MCV: 96 fL (ref 80–100)
PLATELETS: 163 10*3/uL (ref 150–440)
RBC: 5.28 10*6/uL (ref 4.40–5.90)
RDW: 13.3 % (ref 11.5–14.5)
WBC: 8.5 10*3/uL (ref 3.8–10.6)

## 2014-12-02 LAB — PROTIME-INR
INR: 0.9
Prothrombin Time: 12.8 secs

## 2021-04-04 ENCOUNTER — Encounter: Payer: Self-pay | Admitting: *Deleted

## 2021-07-25 ENCOUNTER — Other Ambulatory Visit: Payer: Self-pay

## 2021-07-25 DIAGNOSIS — R110 Nausea: Secondary | ICD-10-CM | POA: Insufficient documentation

## 2021-07-25 DIAGNOSIS — I7143 Infrarenal abdominal aortic aneurysm, without rupture: Secondary | ICD-10-CM | POA: Insufficient documentation

## 2021-07-25 DIAGNOSIS — M545 Low back pain, unspecified: Secondary | ICD-10-CM | POA: Diagnosis present

## 2021-07-25 DIAGNOSIS — Z7982 Long term (current) use of aspirin: Secondary | ICD-10-CM | POA: Insufficient documentation

## 2021-07-25 DIAGNOSIS — R109 Unspecified abdominal pain: Secondary | ICD-10-CM | POA: Insufficient documentation

## 2021-07-25 DIAGNOSIS — I2510 Atherosclerotic heart disease of native coronary artery without angina pectoris: Secondary | ICD-10-CM | POA: Diagnosis not present

## 2021-07-25 DIAGNOSIS — I10 Essential (primary) hypertension: Secondary | ICD-10-CM | POA: Insufficient documentation

## 2021-07-25 DIAGNOSIS — Z79899 Other long term (current) drug therapy: Secondary | ICD-10-CM | POA: Diagnosis not present

## 2021-07-25 MED ORDER — OXYCODONE-ACETAMINOPHEN 5-325 MG PO TABS
1.0000 | ORAL_TABLET | Freq: Once | ORAL | Status: AC
  Administered 2021-07-26: 1 via ORAL
  Filled 2021-07-25: qty 1

## 2021-07-25 MED ORDER — ONDANSETRON 4 MG PO TBDP
4.0000 mg | ORAL_TABLET | Freq: Once | ORAL | Status: AC
  Administered 2021-07-26: 4 mg via ORAL
  Filled 2021-07-25: qty 1

## 2021-07-25 NOTE — ED Triage Notes (Signed)
Pt with left lower back pain that began a week ago. Pt states today is not able to walk, sit due to pain. Pt received IV pain medication with ems. Pt denies fever, loss of bowel or bladder.

## 2021-07-25 NOTE — ED Notes (Signed)
Pt brought by [facility], with back pain complaints. EMS administered of Fentanyl for pain with no relief. Pain located to left side lower back and radiates to left buttocks

## 2021-07-26 ENCOUNTER — Emergency Department
Admission: EM | Admit: 2021-07-26 | Discharge: 2021-07-26 | Disposition: A | Discharge status: Home / Self Care | Payer: 59 | Attending: Emergency Medicine | Admitting: Emergency Medicine

## 2021-07-26 ENCOUNTER — Emergency Department: Payer: 59

## 2021-07-26 DIAGNOSIS — M545 Low back pain, unspecified: Secondary | ICD-10-CM

## 2021-07-26 DIAGNOSIS — I7143 Infrarenal abdominal aortic aneurysm, without rupture: Secondary | ICD-10-CM

## 2021-07-26 LAB — URINALYSIS, ROUTINE W REFLEX MICROSCOPIC
Bilirubin Urine: NEGATIVE
Glucose, UA: NEGATIVE mg/dL
Hgb urine dipstick: NEGATIVE
Ketones, ur: NEGATIVE mg/dL
Leukocytes,Ua: NEGATIVE
Nitrite: NEGATIVE
Protein, ur: NEGATIVE mg/dL
Specific Gravity, Urine: 1.027 (ref 1.005–1.030)
pH: 5 (ref 5.0–8.0)

## 2021-07-26 LAB — CBC WITH DIFFERENTIAL/PLATELET
Abs Immature Granulocytes: 0.03 10*3/uL (ref 0.00–0.07)
Basophils Absolute: 0.1 10*3/uL (ref 0.0–0.1)
Basophils Relative: 1 %
Eosinophils Absolute: 0 10*3/uL (ref 0.0–0.5)
Eosinophils Relative: 0 %
HCT: 47 % (ref 39.0–52.0)
Hemoglobin: 17 g/dL (ref 13.0–17.0)
Immature Granulocytes: 0 %
Lymphocytes Relative: 20 %
Lymphs Abs: 1.9 10*3/uL (ref 0.7–4.0)
MCH: 34.6 pg — ABNORMAL HIGH (ref 26.0–34.0)
MCHC: 36.2 g/dL — ABNORMAL HIGH (ref 30.0–36.0)
MCV: 95.5 fL (ref 80.0–100.0)
Monocytes Absolute: 0.6 10*3/uL (ref 0.1–1.0)
Monocytes Relative: 6 %
Neutro Abs: 6.9 10*3/uL (ref 1.7–7.7)
Neutrophils Relative %: 73 %
Platelets: 168 10*3/uL (ref 150–400)
RBC: 4.92 MIL/uL (ref 4.22–5.81)
RDW: 11.6 % (ref 11.5–15.5)
WBC: 9.6 10*3/uL (ref 4.0–10.5)
nRBC: 0 % (ref 0.0–0.2)

## 2021-07-26 LAB — COMPREHENSIVE METABOLIC PANEL
ALT: 75 U/L — ABNORMAL HIGH (ref 0–44)
AST: 59 U/L — ABNORMAL HIGH (ref 15–41)
Albumin: 4.1 g/dL (ref 3.5–5.0)
Alkaline Phosphatase: 61 U/L (ref 38–126)
Anion gap: 8 (ref 5–15)
BUN: 13 mg/dL (ref 8–23)
CO2: 24 mmol/L (ref 22–32)
Calcium: 9.2 mg/dL (ref 8.9–10.3)
Chloride: 106 mmol/L (ref 98–111)
Creatinine, Ser: 0.87 mg/dL (ref 0.61–1.24)
GFR, Estimated: >60 mL/min (ref >60)
Glucose, Bld: 124 mg/dL — ABNORMAL HIGH (ref 70–99)
Potassium: 4.2 mmol/L (ref 3.5–5.1)
Sodium: 138 mmol/L (ref 135–145)
Total Bilirubin: 0.9 mg/dL (ref 0.3–1.2)
Total Protein: 7.2 g/dL (ref 6.5–8.1)

## 2021-07-26 LAB — LIPASE, BLOOD: Lipase: 34 U/L (ref 11–51)

## 2021-07-26 MED ORDER — HYDROMORPHONE HCL 1 MG/ML IJ SOLN
1.0000 mg | Freq: Once | INTRAMUSCULAR | Status: AC
  Administered 2021-07-26: 1 mg via INTRAVENOUS
  Filled 2021-07-26: qty 1

## 2021-07-26 MED ORDER — ONDANSETRON HCL 4 MG/2ML IJ SOLN
4.0000 mg | Freq: Once | INTRAMUSCULAR | Status: AC
  Administered 2021-07-26: 4 mg via INTRAVENOUS
  Filled 2021-07-26: qty 2

## 2021-07-26 MED ORDER — PREDNISONE 10 MG (21) PO TBPK: ORAL_TABLET | ORAL | 0 refills | Status: DC

## 2021-07-26 MED ORDER — DEXAMETHASONE SODIUM PHOSPHATE 10 MG/ML IJ SOLN
10.0000 mg | Freq: Once | INTRAMUSCULAR | Status: AC
  Administered 2021-07-26: 10 mg via INTRAVENOUS
  Filled 2021-07-26: qty 1

## 2021-07-26 MED ORDER — IBUPROFEN 800 MG PO TABS: 800.0000 mg | ORAL_TABLET | Freq: Three times a day (TID) | ORAL | 0 refills | Status: DC | PRN

## 2021-07-26 MED ORDER — ONDANSETRON 4 MG PO TBDP: 4.0000 mg | ORAL_TABLET | Freq: Four times a day (QID) | ORAL | 0 refills | Status: AC | PRN

## 2021-07-26 MED ORDER — KETOROLAC TROMETHAMINE 30 MG/ML IJ SOLN
30.0000 mg | Freq: Once | INTRAMUSCULAR | Status: AC
  Administered 2021-07-26: 30 mg via INTRAVENOUS
  Filled 2021-07-26: qty 1

## 2021-07-26 MED ORDER — METOCLOPRAMIDE HCL 5 MG/ML IJ SOLN
10.0000 mg | Freq: Once | INTRAMUSCULAR | Status: AC
  Administered 2021-07-26: 10 mg via INTRAVENOUS
  Filled 2021-07-26: qty 2

## 2021-07-26 MED ORDER — OXYCODONE-ACETAMINOPHEN 5-325 MG PO TABS
2.0000 | ORAL_TABLET | Freq: Four times a day (QID) | ORAL | 0 refills | Status: DC | PRN
Start: 2021-07-26 — End: 2021-09-06

## 2021-07-26 NOTE — ED Provider Notes (Signed)
Emergency Medicine Provider Triage Evaluation Note  JHALEN ELEY Sr. , a 65 y.o. male  was evaluated in triage.  Pt complains of left sided lower back pain.  No trauma history.  No personal history of kidney stones. No numbness/tingling.  No urinary symptoms including retention/incontinence.  Review of Systems  Positive: left sided low back pain Negative: Numbness, tingling, trauma, urinary retention, urinary incontinence, fever, chest pain, shortness of breath.  Physical Exam  BP (!) 200/97 (BP Location: Right Arm)   Pulse 67   Temp 98 F (36.7 C) (Oral)   Resp 20   Ht 1.778 m (5\' 10" )   Wt 95.3 kg   SpO2 95%   BMI 30.13 kg/m  Gen:   Awake, no distress   Resp:  Normal effort  MSK:   Moves extremities without difficulty but reports pain with moving, ambulation, etc. Other:  No focal neurological deficits.  No abdominal tenderness palpation.  Medical Decision Making  Medically screening exam initiated at 12:36 AM.  Appropriate orders placed.  Sr. was informed that the remainder of the evaluation will be completed by another provider, this initial triage assessment does not replace that evaluation, and the importance of remaining in the ED until their evaluation is complete.  Evaluating with CT renal stone protocol and basic labs.   Nolene Ebbs, MD 07/26/21 [phone]

## 2021-07-26 NOTE — ED Notes (Signed)
Pt up to get urine sample and then walked around room. Pt N/V. Pt back in chair and report back pain of 4/10. MD aware

## 2021-07-26 NOTE — ED Notes (Signed)
Pt monitored after meds for . No reactions. Breathing even and unladored

## 2021-07-26 NOTE — ED Provider Notes (Signed)
[facility] Emergency Department Provider Note  ____________________________________________   Event Date/Time   First MD Initiated Contact with Patient 07/26/21 [phone]     (approximate)  I have reviewed the triage vital signs and the nursing notes.   HISTORY  Chief Complaint Back Pain    HPI Charles Benjamin. is a 65 y.o. male with history of hypertension, hyperlipidemia, CAD status post stent who presents to the emergency department with complaints of left lower back pain for the past several days.  Pain is sharp and severe and worse with movement.  He denies any known injury.  No history of fevers, numbness, tingling, focal weakness, bowel or bladder incontinence, urinary retention.  Denies any previous back surgeries or epidural injections.  No history of diabetes, HIV, cancer, IV drug abuse.  Denies chest or abdominal pain.  No dysuria or hematuria.        Past Medical History:  Diagnosis Date   Coronary artery disease    Inferior myocardial infarction-RCA DES, stent 01/02/12, CTO of prox-mid LAD   Gastro - esophageal reflux    Hyperlipidemia    Hypertension     Patient Active Problem List   Diagnosis Date Noted   Coronary arteriosclerosis in native artery 01/27/2013    Class: Chronic   Paroxysmal SVT (supraventricular tachycardia) ([place]) 01/27/2013    Class: Acute   Intermediate coronary syndrome ([place]) 01/25/2013   Inferior myocardial infarction ([place]) 01/03/2012    Past Surgical History:  Procedure Laterality Date   CORONARY STENT PLACEMENT     LEFT HEART CATH N/A 01/02/2012   Procedure: LEFT HEART CATH;  Surgeon: Sinclair Grooms, MD;  Location: [facility] C[facility] LAB;  Service: Cardiovascular;  Laterality: N/A;   LEFT HEART CATHETERIZATION WITH CORONARY ANGIOGRAM Right 01/26/2013   Procedure: LEFT HEART CATHETERIZATION WITH CORONARY ANGIOGRAM Possible PCI;  Surgeon: Sinclair Grooms, MD;  Location[facility] C[facility] LAB;  Service: Cardiovascular;  Laterality:  Right;    Prior to Admission medications   Medication Sig Start Date End Date Taking? Authorizing Provider  ibuprofen (ADVIL) 800 MG tablet Take 1 tablet (800 mg total) by mouth every 8 (eight) hours as needed for mild pain. 07/26/21  Yes Nattalie Santiesteban N, DO  ondansetron (ZOFRAN ODT) 4 MG disintegrating tablet Take 1 tablet (4 mg total) by mouth every 6 (six) hours as needed for nausea or vomiting. 07/26/21  Yes Kameah Rawl, Delice Bison, DO  oxyCODONE-acetaminophen (PERCOCET/ROXICET) 5-325 MG tablet Take 2 tablets by mouth every 6 (six) hours as needed. 07/26/21  Yes Eun Vermeer, Delice Bison, DO  predniSONE (STERAPRED UNI-PAK 21 TAB) 10 MG (21) TBPK tablet Take as directed 07/26/21  Yes Aaronjames Kelsay, Delice Bison, DO  aspirin 325 MG EC tablet Take 325 mg by mouth daily.    [provider]  bismuth subsalicylate (PEPTO BISMOL) 262 MG chewable tablet Chew 524 mg by mouth as needed for indigestion or heartburn.    [provider]  clopidogrel (PLAVIX) 75 MG tablet Take 1 tablet (75 mg total) by mouth daily with breakfast. 01/28/13   Belva Crome, MD  meclizine (ANTIVERT) 50 MG tablet Take 1 tablet (50 mg total) by mouth 3 (three) times daily as needed for dizziness. 04/03/13   Blanchie Dessert, MD  metoprolol succinate (TOPROL-XL) 50 MG 24 hr tablet Take 1 tablet (50 mg total) by mouth daily. Take with or immediately following a meal. 11/09/13   Belva Crome, MD  nitroGLYCERIN (NITROSTAT) 0.4 MG SL tablet Place 1  tablet (0.4 mg total) under the tongue every 5 (five) minutes x 3 doses as needed for chest pain. 01/27/13 01/27/14  Lyn Records, MD  simvastatin (ZOCOR) 20 MG tablet Take 1 tablet (20 mg total) by mouth daily at 6 PM. 01/27/13   Lyn Records, MD    Allergies Shrimp [shellfish allergy] and Pravastatin  No family history on file.  Social History Social History   Tobacco Use   Smoking status: Never  Substance Use Topics   Alcohol use: No   Drug use: No    Review of  Systems Constitutional: No fever. Eyes: No visual changes. ENT: No sore throat. Cardiovascular: Denies chest pain. Respiratory: Denies shortness of breath. Gastrointestinal: No nausea, vomiting, diarrhea. Genitourinary: Negative for dysuria. Musculoskeletal: + for back pain. Skin: Negative for rash. Neurological: Negative for focal weakness or numbness.  ____________________________________________   PHYSICAL EXAM:  VITAL SIGNS: ED Triage Vitals [07/25/21 1946]  Enc Vitals Group     BP (!) 155/86     Pulse Rate 65     Resp 16     Temp 98 F (36.7 C)     Temp Source Oral     SpO2 95 %     Weight 210 lb (95.3 kg)     Height 5\' 10"  (1.778 m)     Head Circumference      Peak Flow      Pain Score 10     Pain Loc      Pain Edu?      Excl. in GC?    CONSTITUTIONAL: Alert and oriented and responds appropriately to questions. Well-appearing; well-nourished, appears uncomfortable when trying to sit upright for me to examine his back HEAD: Normocephalic EYES: Conjunctivae clear, pupils appear equal, EOM appear intact ENT: normal nose; moist mucous membranes NECK: Supple, normal ROM CARD: RRR; S1 and S2 appreciated; no murmurs, no clicks, no rubs, no gallops RESP: Normal chest excursion without splinting or tachypnea; breath sounds clear and equal bilaterally; no wheezes, no rhonchi, no rales, no hypoxia or respiratory distress, speaking full sentences ABD/GI: Normal bowel sounds; non-distended; soft, non-tender, no rebound, no guarding, no peritoneal signs, no hepatosplenomegaly BACK: The back appears normal, tender to palpation over the left lower paraspinal muscles of the lumbar spine.  There is no midline step-off or deformity and no midline spinal tenderness.  He has no redness, warmth, soft tissue swelling, rash, ecchymosis present. EXT: Normal ROM in all joints; no deformity noted, no edema; no cyanosis SKIN: Normal color for age and race; warm; no rash on exposed  skin NEURO: Moves all extremities equally, normal sensation diffusely, no saddle anesthesia, no clonus, 2+ deep tendon reflexes in bilateral lower extremities, ambulates with steady gait PSYCH: The patient's mood and manner are appropriate.  ____________________________________________   LABS (all labs ordered are listed, but only abnormal results are displayed)  Labs Reviewed  URINALYSIS, ROUTINE W REFLEX MICROSCOPIC - Abnormal; Notable for the following components:      Result Value   Color, Urine YELLOW (*)    APPearance CLEAR (*)    All other components within normal limits  COMPREHENSIVE METABOLIC PANEL - Abnormal; Notable for the following components:   Glucose, Bld 124 (*)    AST 59 (*)    ALT 75 (*)    All other components within normal limits  CBC WITH DIFFERENTIAL/PLATELET - Abnormal; Notable for the following components:   MCH 34.6 (*)    MCHC 36.2 (*)    All  other components within normal limits  LIPASE, BLOOD   ____________________________________________  EKG   ____________________________________________  RADIOLOGY I, Jacarri Gesner, personally viewed and evaluated these images (plain radiographs) as part of my medical decision making, as well as reviewing the written report by the radiologist.  ED MD interpretation: CT scan shows no kidney stone.  No abnormality of the thoracic or lumbar spine.  Official radiology report(s): CT Renal Stone Study  Result Date: 07/26/2021 CLINICAL DATA:  Flank pain. EXAM: CT ABDOMEN AND PELVIS WITHOUT CONTRAST TECHNIQUE: Multidetector CT imaging of the abdomen and pelvis was performed following the standard protocol without IV contrast. COMPARISON:  CT with oral contrast only 11/12/2005 FINDINGS: Lower chest: The cardiac size is normal. There is patchy three-vessel calcific CAD. Small hiatal hernia. Lung bases are clear. Hepatobiliary: There are moderate features of hepatic steatosis increased from the prior study. Gallbladder bile  ducts are unremarkable. Pancreas: Partial atrophy of the tail segment. No other focal abnormality without contrast. Spleen: Normal in noncontrast attenuation and size. Adrenals/Urinary Tract: There is no adrenal mass. There is increased cortical scarring along the inferior pole of the left kidney in the interval, with no other focal abnormality of unenhanced renal cortex. Symmetric perinephric stranding is increased over the prior study but is symmetric and possibly chronic. There is no evidence of urinary stone or obstruction, unremarkable bladder. Stomach/Bowel: No dilatation or wall thickening including the appendix. Scattered uncomplicated diverticulosis. Vascular/Lymphatic: Aortic atherosclerosis. No enlarged abdominal or pelvic lymph nodes. The aorta and iliac arteries are increasingly calcified compared with 2007 and there has developed a saccular infrarenal AAA measuring 3.1 cm AP and 2.3 cm transverse and extending a length of 3.4 cm. Reproductive: The prostate mildly enlarged. Chunky calcifications in the central prostate are again shown. Other: There are small umbilical and inguinal fat hernias, increased subcutaneous and abdominal fat in the interval consistent with weight gain. There is no free air, hemorrhage or fluid. Musculoskeletal: There are bridging osteophytes at the anterior SI joints. There is bridging bone between the spinous processes of T11-L1. There is no worrisome regional skeletal lesion. IMPRESSION: 1. No evidence of urinary stones or obstruction. 2. Increased symmetric perinephric fat stranding. This is probably chronic and age-related, remotely could be due to pyelonephritis but there is no thickening of the bladder wall. 3. Mild prostatomegaly. 4. Increasingly fatty liver. 5. Three-vessel calcific CAD with interval development of a 3.1 x 2.3 cm saccular infrarenal AAA with increasing aortoiliac calcific disease since 2007. Vascular surgery referral recommended. 6. Small hiatal hernia,  small umbilical and inguinal fat hernias. Electronically Signed   By: Telford Nab M.D.   On: 07/26/2021 01:04    ____________________________________________   PROCEDURES  Procedure(s) performed (including Critical Care):  Procedures   ____________________________________________   INITIAL IMPRESSION / ASSESSMENT AND PLAN / ED COURSE  As part of my medical decision making, I reviewed the following data within the [facility] notes reviewed and incorporated, Labs reviewed , Old chart reviewed, CT reviewed, Notes from prior ED visits, and [place] Controlled Substance Database         Patient with complaints of lower back pain.  He has no red flag symptoms to suggest fracture, cauda equina, spinal stenosis, epidural abscess or hematoma, discitis or osteomyelitis, transverse myelitis.  He is neurologically intact here.  Pain seems to be musculoskeletal in nature since it is worse with movement and palpation.  Labs obtained from triage are unremarkable.  CT scan obtained from triage shows no  kidney stone.  He does have some perinephric stranding bilaterally which appears chronic.  Will obtain urinalysis to rule out infection.  He incidentally has and a infrarenal AAA as well.  No sign of rupture.  His symptoms are not suggestive of dissection.  He has strong equal pulses in all 4 extremities.  Will give pain medication, Decadron and reassess.  Discussed with patient that I do not feel he has an emergent neurosurgical process present and does not need an emergent MRI but that if pain is not improving with symptomatic management that he should follow-up with his primary care doctor for outpatient imaging.  ED PROGRESS  Patient reports feeling better after Dilaudid.  He is nauseated which she states is normal for him after getting narcotics.  He did vomit once.  This improved after a dose of Reglan.  States he is feeling better and able to ambulate without assistance to the  bathroom.  He feels comfortable with the plan for discharge home.  Will discharge with Percocet, Zofran, prednisone taper and ibuprofen.  He states that he has done well with Percocet previously.  Recommended close follow-up with his primary care doctor if pain is not improving.  Discussed at length return precautions.  Also recommended close follow-up with vascular surgery given the finding of AAA on CT imaging.  Patient verbalized understanding and is comfortable with this plan.   At this time, I do not feel there is any life-threatening condition present. I have reviewed, interpreted and discussed all results (EKG, imaging, lab, urine as appropriate) and exam findings with patient/family. I have reviewed nursing notes and appropriate previous records.  I feel the patient is safe to be discharged home without further emergent workup and can continue workup as an outpatient as needed. Discussed usual and customary return precautions. Patient/family verbalize understanding and are comfortable with this plan.  Outpatient follow-up has been provided as needed. All questions have been answered.  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 was evaluated in Emergency Department on 07/26/2021 for the symptoms described in the history of present illness. He was evaluated in the context  of the global COVID-19 pandemic, which necessitated consideration that the patient might be at risk for infection with the SARS-CoV-2 virus that causes COVID-19. Institutional protocols and algorithms that pertain to the evaluation of patients at risk for COVID-19 are in a state of rapid change based on information released by regulatory bodies including the CDC and federal and state organizations. These policies and algorithms were followed during the patient's care in the ED.  Some ED evaluations and interventions may be delayed as a result of limited staffing during  and the pandemic.*   Note:  This document was prepared using Dragon voice recognition software and may include unintentional dictation errors.    Bobette Leyh, Delice Bison, DO 07/26/21 [phone]

## 2021-07-26 NOTE — ED Notes (Signed)
Rn to bedside to introduce self to pt. Pt in recliner and in no acute distress.

## 2021-07-26 NOTE — ED Notes (Signed)
Pt sleeping NADN 

## 2021-07-26 NOTE — Discharge Instructions (Addendum)
You are being provided a prescription for opiates (also known as narcotics) for pain control.  Opiates can be addictive and should only be used when absolutely necessary for pain control when other alternatives do not work.  We recommend you only use them for the recommended amount of time and only as prescribed.  Please do not take with other sedative medications or alcohol.  Please do not drive, operate machinery, make important decisions while taking opiates.  Please note that these medications can be addictive and have high abuse potential.  Patients can become addicted to narcotics after only taking them for a few days.  Please keep these medications locked away from children, teenagers or any family members with history of substance abuse.  Narcotic pain medicine may also make you constipated.  You may use over-the-counter medications such as MiraLAX, Colace to prevent constipation.  If you become constipated you may use over-the-counter enemas as needed.  Itching and nausea are common side effects of narcotic pain medication.  If you develop uncontrolled vomiting or a rash, please stop these medications.   Your CT scan did not show any cause of your pain but did show a "3.1 x 2.3 cm saccular infrarenal AAA with increasing aortoiliac calcific disease" and we recommend close follow-up with vascular surgery.   If you begin having worsening back pain, abdominal pain, chest pain, numbness or weakness in your arms or legs, difficulty holding your bowel or bladder, difficulty emptying your bladder, fever of 100.4 or higher, vomiting that does not stop, please return to the emergency department.     Steps to find a Primary Care Provider (PCP):  Call [phone] or [phone] to access "[place] Find a Doctor Service."  2.  You may also go on the [facility] website at [email or web address]

## 2021-09-05 ENCOUNTER — Emergency Department (HOSPITAL_COMMUNITY)
Admission: EM | Admit: 2021-09-05 | Discharge: 2021-09-06 | Disposition: A | Discharge status: Home / Self Care | Payer: 59 | Attending: Emergency Medicine | Admitting: Emergency Medicine

## 2021-09-05 ENCOUNTER — Other Ambulatory Visit: Payer: Self-pay

## 2021-09-05 ENCOUNTER — Emergency Department (HOSPITAL_COMMUNITY): Payer: 59

## 2021-09-05 DIAGNOSIS — T148XXA Other injury of unspecified body region, initial encounter: Secondary | ICD-10-CM

## 2021-09-05 DIAGNOSIS — Z7902 Long term (current) use of antithrombotics/antiplatelets: Secondary | ICD-10-CM | POA: Insufficient documentation

## 2021-09-05 DIAGNOSIS — Z7982 Long term (current) use of aspirin: Secondary | ICD-10-CM | POA: Insufficient documentation

## 2021-09-05 DIAGNOSIS — W1839XA Other fall on same level, initial encounter: Secondary | ICD-10-CM | POA: Insufficient documentation

## 2021-09-05 DIAGNOSIS — Y92009 Unspecified place in unspecified non-institutional (private) residence as the place of occurrence of the external cause: Secondary | ICD-10-CM | POA: Diagnosis not present

## 2021-09-05 DIAGNOSIS — S82831A Other fracture of upper and lower end of right fibula, initial encounter for closed fracture: Secondary | ICD-10-CM | POA: Diagnosis not present

## 2021-09-05 DIAGNOSIS — S99911A Unspecified injury of right ankle, initial encounter: Secondary | ICD-10-CM | POA: Diagnosis present

## 2021-09-05 DIAGNOSIS — I2510 Atherosclerotic heart disease of native coronary artery without angina pectoris: Secondary | ICD-10-CM | POA: Insufficient documentation

## 2021-09-05 DIAGNOSIS — I10 Essential (primary) hypertension: Secondary | ICD-10-CM | POA: Insufficient documentation

## 2021-09-05 MED ORDER — ONDANSETRON HCL 4 MG/2ML IJ SOLN
4.0000 mg | Freq: Once | INTRAMUSCULAR | Status: AC
  Administered 2021-09-05: 4 mg via INTRAVENOUS
  Filled 2021-09-05: qty 2

## 2021-09-05 MED ORDER — HYDROMORPHONE HCL 1 MG/ML IJ SOLN
1.0000 mg | Freq: Once | INTRAMUSCULAR | Status: AC
  Administered 2021-09-05: 1 mg via INTRAVENOUS
  Filled 2021-09-05: qty 1

## 2021-09-05 MED ORDER — LIDOCAINE-EPINEPHRINE (PF) 2 %-1:200000 IJ SOLN
20.0000 mL | Freq: Once | INTRAMUSCULAR | Status: AC
  Administered 2021-09-06: 20 mL
  Filled 2021-09-05: qty 20

## 2021-09-05 NOTE — ED Provider Notes (Signed)
[facility] EMERGENCY DEPARTMENT Provider Note   CSN: [id number] Arrival date & time: 09/05/21  2044     History Chief Complaint  Patient presents with   Fall    Ankle pain    Charles Benjamin. is a 65 y.o. male with past medical history significant for CAD, GERD, HLD, HTN who presents after a fall.  The patient states he had a mechanical fall at home and rolled his right ankle.  He had severe right ankle pain after the fall.  He has been unable to ambulate since.  He did not hit his head or lose consciousness.  He denies chest pain, lightheadedness, or shortness of breath preceding his fall.    Past Medical History:  Diagnosis Date   Coronary artery disease    Inferior myocardial infarction-RCA DES, stent 01/02/12, CTO of prox-mid LAD   Gastro - esophageal reflux    Hyperlipidemia    Hypertension     Patient Active Problem List   Diagnosis Date Noted   Coronary arteriosclerosis in native artery 01/27/2013    Class: Chronic   Paroxysmal SVT (supraventricular tachycardia) (HCC) 01/27/2013    Class: Acute   Intermediate coronary syndrome (HCC) 01/25/2013   Inferior myocardial infarction (HCC) 01/03/2012    Past Surgical History:  Procedure Laterality Date   CORONARY STENT PLACEMENT     LEFT HEART CATH N/A 01/02/2012   Procedure: LEFT HEART CATH;  Surgeon: Lesleigh Noe, MD;  Location: [facility] CATH LAB;  Service: Cardiovascular;  Laterality: N/A;   LEFT HEART CATHETERIZATION WITH CORONARY ANGIOGRAM Right 01/26/2013   Procedure: LEFT HEART CATHETERIZATION WITH CORONARY ANGIOGRAM Possible PCI;  Surgeon: Lesleigh Noe, MD;  Location: [facility] CATH LAB;  Service: Cardiovascular;  Laterality: Right;       No family history on file.  Social History   Tobacco Use   Smoking status: Never  Substance Use Topics   Alcohol use: No   Drug use: No    Home Medications Prior to Admission medications   Medication Sig Start Date End Date Taking? Authorizing  Provider  aspirin 325 MG EC tablet Take 325 mg by mouth daily.    [provider]  bismuth subsalicylate (PEPTO BISMOL) 262 MG chewable tablet Chew 524 mg by mouth as needed for indigestion or heartburn.    [provider]  clopidogrel (PLAVIX) 75 MG tablet Take 1 tablet (75 mg total) by mouth daily with breakfast. 01/28/13   Lyn Records, MD  HYDROcodone-acetaminophen (NORCO) 5-325 MG tablet Take 1 tablet by mouth every 6 (six) hours as needed for moderate pain. 09/06/21   Sabas Sous, MD  ibuprofen (ADVIL) 800 MG tablet Take 1 tablet (800 mg total) by mouth every 8 (eight) hours as needed for mild pain. 07/26/21   Ward, Layla Maw, DO  meclizine (ANTIVERT) 50 MG tablet Take 1 tablet (50 mg total) by mouth 3 (three) times daily as needed for dizziness. 04/03/13   Gwyneth Sprout, MD  metoprolol succinate (TOPROL-XL) 50 MG 24 hr tablet Take 1 tablet (50 mg total) by mouth daily. Take with or immediately following a meal. 11/09/13   Lyn Records, MD  nitroGLYCERIN (NITROSTAT) 0.4 MG SL tablet Place 1 tablet (0.4 mg total) under the tongue every 5 (five) minutes x 3 doses as needed for chest pain. 01/27/13 01/27/14  Lyn Records, MD  ondansetron (ZOFRAN ODT) 4 MG disintegrating tablet Take 1 tablet (4 mg total) by mouth every 6 (six)  hours as needed for nausea or vomiting. 07/26/21   Ward, Delice Bison, DO  predniSONE (STERAPRED UNI-PAK 21 TAB) 10 MG (21) TBPK tablet Take as directed 07/26/21   Ward, Delice Bison, DO  simvastatin (ZOCOR) 20 MG tablet Take 1 tablet (20 mg total) by mouth daily at 6 PM. 01/27/13   Belva Crome, MD    Allergies    Shrimp [shellfish allergy] and Pravastatin  Review of Systems   Review of Systems  Constitutional:  Negative for chills and fever.  HENT:  Negative for ear pain and sore throat.   Eyes:  Negative for pain and visual disturbance.  Respiratory:  Negative for cough and shortness of breath.   Cardiovascular:  Negative for chest pain and  palpitations.  Gastrointestinal:  Negative for abdominal pain and vomiting.  Genitourinary:  Negative for dysuria and hematuria.  Musculoskeletal:  Positive for arthralgias and joint swelling. Negative for back pain.  Skin:  Negative for color change and rash.  Neurological:  Negative for seizures and syncope.  All other systems reviewed and are negative.  Physical Exam Updated Vital Signs BP 128/73    Pulse 72    Temp 98 F (36.7 C)    Resp 16    Ht 5\' 10"  (1.778 m)    Wt 90.7 kg    SpO2 100%    BMI 28.70 kg/m   Physical Exam Vitals and nursing note reviewed.  Constitutional:      General: He is not in acute distress.    Appearance: He is well-developed.  HENT:     Head: Normocephalic and atraumatic.     Right Ear: External ear normal.     Left Ear: External ear normal.     Nose: Nose normal.     Mouth/Throat:     Pharynx: Oropharynx is clear.  Eyes:     Extraocular Movements: Extraocular movements intact.     Conjunctiva/sclera: Conjunctivae normal.     Pupils: Pupils are equal, round, and reactive to light.  Cardiovascular:     Rate and Rhythm: Normal rate and regular rhythm.     Pulses: Normal pulses.          Dorsalis pedis pulses are 2+ on the right side and 2+ on the left side.       Posterior tibial pulses are 2+ on the right side and 2+ on the left side.     Heart sounds: Normal heart sounds. No murmur heard. Pulmonary:     Effort: Pulmonary effort is normal. No respiratory distress.     Breath sounds: Normal breath sounds.  Abdominal:     Palpations: Abdomen is soft.     Tenderness: There is no abdominal tenderness.  Musculoskeletal:        General: Tenderness (R ankle), deformity (R ankle) and signs of injury (R ankle) present.     Cervical back: Neck supple.  Skin:    General: Skin is warm and dry.     Capillary Refill: Capillary refill takes less than 2 seconds.  Neurological:     Mental Status: He is alert.     GCS: GCS eye subscore is 4. GCS verbal  subscore is 5. GCS motor subscore is 6.     Sensory: Sensation is intact.     Motor: Motor function is intact.  Psychiatric:        Mood and Affect: Mood normal.    ED Results / Procedures / Treatments   Labs (all labs ordered are listed,  but only abnormal results are displayed) Labs Reviewed - No data to display  EKG None  Radiology DG Tibia/Fibula Right  Result Date: 09/05/2021 CLINICAL DATA:  Ankle pain. EXAM: RIGHT ANKLE - COMPLETE 3+ VIEW; RIGHT TIBIA AND FIBULA - 2 VIEW COMPARISON:  None. FINDINGS: Right ankle: There is an acute oblique fracture through the distal fibula at and above the level of the ankle mortise. The fracture fragments are distracted 6 mm. There is no evidence for dislocation. There is abnormal widening of the medial talotibial joint space. There are 2 small ossific densities in the joint space which may represent small avulsion fractures, donor site uncertain. There is joint effusion and soft tissue swelling. Right tibia and fibula: No additional fractures of the right tibia/fibula. No dislocation. Soft tissues unremarkable. IMPRESSION: 1. Mildly displaced fracture of the distal fibula. 2. Disruption of the ankle joint with abnormal widening of the medial talotibial joint space. There are small avulsion fracture fragments in the joint space is well. Electronically Signed   By: Ronney Asters M.D.   On: 09/05/2021 21:53   DG Ankle Complete Right  Result Date: 09/06/2021 CLINICAL DATA:  Right ankle fracture, post reduction imaging EXAM: RIGHT ANKLE - COMPLETE 3+ VIEW COMPARISON:  09/05/2021 FINDINGS: Three view radiograph right ankle performed within an external immobilizer demonstrates grossly stable alignment with widening of the medial joint space, 1 cortical with posterior and lateral displacement of the distal fibular fracture fragment, and mild posterior displacement of the posterior malleolar fracture fragment. External immobilizer material obscures fine bony  detail. IMPRESSION: Interval application of an external immobilizer. Stable alignment of bimalleolar fracture subluxation of the right ankle. Electronically Signed   By: Fidela Salisbury M.D.   On: 09/06/2021 00:48   DG Ankle Complete Right  Result Date: 09/05/2021 CLINICAL DATA:  Ankle pain. EXAM: RIGHT ANKLE - COMPLETE 3+ VIEW; RIGHT TIBIA AND FIBULA - 2 VIEW COMPARISON:  None. FINDINGS: Right ankle: There is an acute oblique fracture through the distal fibula at and above the level of the ankle mortise. The fracture fragments are distracted 6 mm. There is no evidence for dislocation. There is abnormal widening of the medial talotibial joint space. There are 2 small ossific densities in the joint space which may represent small avulsion fractures, donor site uncertain. There is joint effusion and soft tissue swelling. Right tibia and fibula: No additional fractures of the right tibia/fibula. No dislocation. Soft tissues unremarkable. IMPRESSION: 1. Mildly displaced fracture of the distal fibula. 2. Disruption of the ankle joint with abnormal widening of the medial talotibial joint space. There are small avulsion fracture fragments in the joint space is well. Electronically Signed   By: Ronney Asters M.D.   On: 09/05/2021 21:53    Procedures Procedures   Medications Ordered in ED Medications  HYDROmorphone (DILAUDID) injection 1 mg (1 mg Intravenous Given 09/05/21 2337)  ondansetron (ZOFRAN) injection 4 mg (4 mg Intravenous Given 09/05/21 2338)  lidocaine-EPINEPHrine (XYLOCAINE W/EPI) 2 %-1:200000 (PF) injection 20 mL (20 mLs Infiltration Given 09/06/21 0013)    ED Course  I have reviewed the triage vital signs and the nursing notes.  Pertinent labs & imaging results that were available during my care of the patient were reviewed by me and considered in my medical decision making (see chart for details).    MDM Rules/Calculators/A&P                          Patient presents with  right ankle  injury as described in HPI above.  Patient denies pain anywhere else, head trauma, or loss of consciousness.  Plain film significant for right distal fibula fracture with 6 mm distraction and abnormal widening of the medial talotibial joint space with small avulsion fracture fragments in the joint space.  Discussed patient with orthopedic surgery who presented to bedside to assist with reduction and splint application.  Patient will be discharged with plan to follow-up in orthopedic surgery clinic.  Short course of Norco sent to patient's pharmacy for pain management.  Patient was discharged in stable condition.  Final Clinical Impression(s) / ED Diagnoses Final diagnoses:  Closed fracture of distal end of right fibula, unspecified fracture morphology, initial encounter    Rx / DC Orders ED Discharge Orders          Ordered    HYDROcodone-acetaminophen (NORCO) 5-325 MG tablet  Every 6 hours PRN,   Status:  Discontinued        09/06/21 0005    HYDROcodone-acetaminophen (NORCO) 5-325 MG tablet  Every 6 hours PRN        09/06/21 0053             Varney Baas, MD 09/06/21 1146    Sherwood Gambler, MD 09/07/21 1510

## 2021-09-05 NOTE — ED Triage Notes (Signed)
Pt BIB [facility]. From home. Mechanical fall. R Ankle ankle pain after hyper-extending. Pulses intact. Refuses pain meds with EMS. No head trauma. No LOC. No CP. No SOB. On Plavix for stent HX.

## 2021-09-06 ENCOUNTER — Emergency Department (HOSPITAL_COMMUNITY): Payer: 59

## 2021-09-06 MED ORDER — HYDROCODONE-ACETAMINOPHEN 5-325 MG PO TABS: 1.0000 | ORAL_TABLET | Freq: Four times a day (QID) | ORAL | 0 refills | Status: DC | PRN

## 2021-09-06 NOTE — Consult Note (Signed)
Orthopaedic Trauma Service (OTS) Consultation   Patient ID: Charles HARTS Sr. MRN: [id number] DOB/AGE: 05/30/1956 65 y.o.   Reason for Consult:right ankle deformity Referring Physician: Pricilla Loveless, MD  HPI: Charles Benjamin Sr. is an 65 y.o. male who fell at home with resulting pain and deformity in the right ankle. Lives alone, now retired. Denies other injury or prior ankle complaints. CAD history.  Past Medical History:  Diagnosis Date   Coronary artery disease    Inferior myocardial infarction-RCA DES, stent 01/02/12, CTO of prox-mid LAD   Gastro - esophageal reflux    Hyperlipidemia    Hypertension     Past Surgical History:  Procedure Laterality Date   CORONARY STENT PLACEMENT     LEFT HEART CATH N/A 01/02/2012   Procedure: LEFT HEART CATH;  Surgeon: Lesleigh Noe, MD;  Location: [facility] CATH LAB;  Service: Cardiovascular;  Laterality: N/A;   LEFT HEART CATHETERIZATION WITH CORONARY ANGIOGRAM Right 01/26/2013   Procedure: LEFT HEART CATHETERIZATION WITH CORONARY ANGIOGRAM Possible PCI;  Surgeon: Lesleigh Noe, MD;  Location: [facility] CATH LAB;  Service: Cardiovascular;  Laterality: Right;    No family history on file.  Social History:  reports that he has never smoked. He does not have any smokeless tobacco history on file. He reports that he does not drink alcohol and does not use drugs.  Allergies:  Allergies  Allergen Reactions   Shrimp [Shellfish Allergy] Anaphylaxis   Pravastatin Other (See Comments)    Muscle pain and weakness    Medications: Prior to Admission: (Not in a hospital admission)   No results found for this or any previous visit (from the past 48 hour(s)).  DG Tibia/Fibula Right  Result Date: 09/05/2021 CLINICAL DATA:  Ankle pain. EXAM: RIGHT ANKLE - COMPLETE 3+ VIEW; RIGHT TIBIA AND FIBULA - 2 VIEW COMPARISON:  None. FINDINGS: Right ankle: There is an acute oblique fracture through the distal fibula at and above the level of the  ankle mortise. The fracture fragments are distracted 6 mm. There is no evidence for dislocation. There is abnormal widening of the medial talotibial joint space. There are 2 small ossific densities in the joint space which may represent small avulsion fractures, donor site uncertain. There is joint effusion and soft tissue swelling. Right tibia and fibula: No additional fractures of the right tibia/fibula. No dislocation. Soft tissues unremarkable. IMPRESSION: 1. Mildly displaced fracture of the distal fibula. 2. Disruption of the ankle joint with abnormal widening of the medial talotibial joint space. There are small avulsion fracture fragments in the joint space is well. Electronically Signed   By: Darliss Cheney M.D.   On: 09/05/2021 21:53   DG Ankle Complete Right  Result Date: 09/05/2021 CLINICAL DATA:  Ankle pain. EXAM: RIGHT ANKLE - COMPLETE 3+ VIEW; RIGHT TIBIA AND FIBULA - 2 VIEW COMPARISON:  None. FINDINGS: Right ankle: There is an acute oblique fracture through the distal fibula at and above the level of the ankle mortise. The fracture fragments are distracted 6 mm. There is no evidence for dislocation. There is abnormal widening of the medial talotibial joint space. There are 2 small ossific densities in the joint space which may represent small avulsion fractures, donor site uncertain. There is joint effusion and soft tissue swelling. Right tibia and fibula: No additional fractures of the right tibia/fibula. No dislocation. Soft tissues unremarkable. IMPRESSION: 1. Mildly displaced fracture of the distal fibula. 2. Disruption of the ankle joint  with abnormal widening of the medial talotibial joint space. There are small avulsion fracture fragments in the joint space is well. Electronically Signed   By: Darliss Cheney M.D.   On: 09/05/2021 21:53    Intake/Output    None      ROS No recent fever, bleeding abnormalities, urologic dysfunction, GI problems, or weight gain.  Blood pressure (!)  158/77, pulse 70, temperature 98.2 F (36.8 C), temperature source Oral, resp. rate 14, height 5\' 10"  (1.778 m), weight 90.7 kg, SpO2 96 %. Physical Exam NCAT, A&O x 4 No lung retractions nor audible wheezing LLE External rotation deformity, tenting skin medially with blanching  Edema/ swelling controlled  Sens: DPN, SPN, TN intact  Motor: EHL, FHL, and lessor toe ext and flex all intact grossly  Brisk cap refill, warm to touch, DP 2+   Assessment/Plan:  Trimall fracture dislocation with external rotation and posterior subluxation  Procedures: 1. Injection right ankle with lidocaine  2. Closed reduction of right ankle dislocation and short leg splint application Asst: , PA-C  Patient tolerated well Post reduction films show restoration of articular congruity  Patient will be discharged [facility] with crutches, pain meds, f/u on Wednesday when we schedule surgical repair.   Tuesday, MD [facility], [facility] [phone]  09/06/2021, 12:26 AM  [facility] [address] [place] [phone] (F)    After 5pm and on the weekends please log on to Amion, go to orthopaedics and the look under the [facility] Call for the provider(s) on call. You can also call our office at [phone] and then follow the prompts to be connected to the call team.

## 2021-09-06 NOTE — Progress Notes (Signed)
Orthopedic Tech Progress Note Patient Details:  Charles Benjamin Sr. June 06, 1956 [id number]  Ortho Devices Type of Ortho Device: Post (short leg) splint, Stirrup splint Ortho Device/Splint Location: RLE Ortho Device/Splint Interventions: Ordered, Application, Adjustment   Post Interventions Patient Tolerated: Well Assisted Dr. Carola Frost and PA apply plaster short leg with stirrups.  Darleen Crocker 09/06/2021, 12:13 AM

## 2021-09-26 ENCOUNTER — Telehealth (HOSPITAL_BASED_OUTPATIENT_CLINIC_OR_DEPARTMENT_OTHER): Payer: Self-pay | Admitting: Orthopaedic Surgery

## 2021-09-26 NOTE — Telephone Encounter (Signed)
error 

## 2021-09-27 ENCOUNTER — Encounter (HOSPITAL_BASED_OUTPATIENT_CLINIC_OR_DEPARTMENT_OTHER): Payer: Self-pay | Admitting: Orthopaedic Surgery

## 2021-09-27 NOTE — Progress Notes (Signed)
Called the patient to discuss treatment options.  He was previously scheduled to undergo open reduction internal fixation of his ankle with Dr. Carola Frost although unfortunately the patient is not an his insurance network.  As result I have transition care and management of his operative ankle fracture.  I have discussed that he does have a fibula fracture with syndesmotic widening.  As result given the nonanatomic nature of the fracture I did discuss the role for lateral plate fixation of the ankle.  We discussed that ideally I would like to continue to keep his operative time on Tuesday given the fact that he has quite difficult time traveling in and out of [place].  We will plan to proceed with operative ankle reduction and fixation

## 2021-09-28 ENCOUNTER — Encounter (HOSPITAL_COMMUNITY): Payer: Self-pay | Admitting: Orthopaedic Surgery

## 2021-09-28 ENCOUNTER — Other Ambulatory Visit: Payer: Self-pay

## 2021-09-28 ENCOUNTER — Ambulatory Visit (HOSPITAL_BASED_OUTPATIENT_CLINIC_OR_DEPARTMENT_OTHER): Payer: Self-pay | Admitting: Orthopaedic Surgery

## 2021-09-28 DIAGNOSIS — S82841A Displaced bimalleolar fracture of right lower leg, initial encounter for closed fracture: Secondary | ICD-10-CM

## 2021-09-28 NOTE — Progress Notes (Signed)
PCP - [facility]  Chest x-ray - N/A EKG - DOS Stress Test - "years ago" ECHO - "years ago" Cardiac Cath - N/A  CPAP - Denies  Blood Thinner Instructions: Stopped by patient Aspirin Instructions: Stop by patient  ERAS Protcol - Clears until 1330  COVID TEST- DOS  Anesthesia review: N  Patient verbally denies any shortness of breath, fever, cough and chest pain during phone call   -------------  SDW INSTRUCTIONS given:  Your procedure is scheduled on 09/30/21.  Report to [facility] Main Entrance "A" at 1330 P.M., and check in at the Admitting office.  Call this number if you have problems the morning of surgery:  [phone]   Remember:  Do not eat after midnight the night before your surgery  You may drink clear liquids until 1330 the afternoon of your surgery.   Clear liquids allowed are: Water, Non-Citrus Juices (without pulp), Carbonated Beverages, Clear Tea, Black Coffee Only, and Gatorade    Take these medicines the morning of surgery with A SIP OF WATER  Lipitor Cardura Tylenol-if needed  As of today, STOP taking any Aspirin (unless otherwise instructed by your surgeon) Aleve, Naproxen, Ibuprofen, Motrin, Advil, Goody's, BC's, all herbal medications, fish oil, and all vitamins.                      Do not wear jewelry, make up, or nail polish            Do not wear lotions, powders, perfumes/colognes, or deodorant.            Do not shave 48 hours prior to surgery.  Men may shave face and neck.            Do not bring valuables to the hospital.            [facility] is not responsible for any belongings or valuables.  Do NOT Smoke (Tobacco/Vaping) 24 hours prior to your procedure If you use a CPAP at night, you may bring all equipment for your overnight stay.   Contacts, glasses, dentures or bridgework may not be worn into surgery.      For patients admitted to the hospital, discharge time will be determined by your treatment team.    Patients discharged the day of surgery will not be allowed to drive home, and someone needs to stay with them for 24 hours.    Special instructions:   [facility]- Preparing For Surgery  Before surgery, you can play an important role. Because skin is not sterile, your skin needs to be as free of germs as possible. You can reduce the number of germs on your skin by washing with CHG (chlorahexidine gluconate) Soap before surgery.  CHG is an antiseptic cleaner which kills germs and bonds with the skin to continue killing germs even after washing.    Oral Hygiene is also important to reduce your risk of infection.  Remember - BRUSH YOUR TEETH THE MORNING OF SURGERY WITH YOUR REGULAR TOOTHPASTE  Please do not use if you have an allergy to CHG or antibacterial soaps. If your skin becomes reddened/irritated stop using the CHG.  Do not shave (including legs and underarms) for at least 48 hours prior to first CHG shower. It is OK to shave your face.  Please follow these instructions carefully.   Shower the NIGHT BEFORE SURGERY and the MORNING OF SURGERY with DIAL Soap.   Pat yourself dry with a CLEAN  TOWEL.  Wear CLEAN PAJAMAS to bed the night before surgery  Place CLEAN SHEETS on your bed the night of your first shower and DO NOT SLEEP WITH PETS.   Day of Surgery: Please shower morning of surgery  Wear Clean/Comfortable clothing the morning of surgery Do not apply any deodorants/lotions.   Remember to brush your teeth WITH YOUR REGULAR TOOTHPASTE.   Questions were answered. Patient verbalized understanding of instructions.

## 2021-09-29 ENCOUNTER — Ambulatory Visit (HOSPITAL_BASED_OUTPATIENT_CLINIC_OR_DEPARTMENT_OTHER): Payer: 59 | Admitting: Orthopaedic Surgery

## 2021-09-29 ENCOUNTER — Telehealth (HOSPITAL_BASED_OUTPATIENT_CLINIC_OR_DEPARTMENT_OTHER): Payer: Self-pay | Admitting: Orthopaedic Surgery

## 2021-09-29 NOTE — Anesthesia Preprocedure Evaluation (Addendum)
Anesthesia Evaluation  Patient identified by MRN, date of birth, ID band Patient awake    Reviewed: Allergy & Precautions, NPO status , Patient's Chart, lab work & pertinent test results  Airway Mallampati: I       Dental no notable dental hx.    Pulmonary neg pulmonary ROS,    Pulmonary exam normal        Cardiovascular hypertension, + CAD, + Past MI and + Cardiac Stents  Normal cardiovascular exam     Neuro/Psych negative neurological ROS  negative psych ROS   GI/Hepatic Neg liver ROS,   Endo/Other  negative endocrine ROS  Renal/GU negative Renal ROS  negative genitourinary   Musculoskeletal   Abdominal Normal abdominal exam  (+)   Peds  Hematology negative hematology ROS (+)   Anesthesia Other Findings   Reproductive/Obstetrics                            Anesthesia Physical Anesthesia Plan  ASA: 2  Anesthesia Plan: General   Post-op Pain Management: Regional block   Induction: Intravenous  PONV Risk Score and Plan: Dexamethasone and Midazolam  Airway Management Planned: LMA  Additional Equipment: None  Intra-op Plan:   Post-operative Plan: Extubation in OR  Informed Consent: I have reviewed the patients History and Physical, chart, labs and discussed the procedure including the risks, benefits and alternatives for the proposed anesthesia with the patient or authorized representative who has indicated his/her understanding and acceptance.     Dental advisory given  Plan Discussed with: CRNA  Anesthesia Plan Comments: (See PAT note by Antionette Poles, PA-C  )      Anesthesia Quick Evaluation

## 2021-09-29 NOTE — Telephone Encounter (Signed)
Question for [employer]

## 2021-09-29 NOTE — Progress Notes (Addendum)
Anesthesia Chart Review: Same-day work-up  History of CAD s/p inferior MI treated with DES to RCA 2013 (follow-up cath 2014 showed widely patent stent), also has CTO of proximal-mid LAD.  Patient does not follow-up with cardiology.  States that all chronic medical conditions are followed by [facility] at the [facility] location.  He reports he has stopped his Plavix and have spent in preparation for surgery.  I spent well over an hour trying obtain records from [facility].  Medical records staff was unwilling to send records stating that they did not refer the patient for surgery.  Front office staff stated they were unable to send records.  I requested to speak with office supervisor. She confirmed they would not send any medical records unless the pt came to their office to sign a release form.  Patient seen in ED 09/05/2021 for mechanical fall resulting in right trimalleolar fracture dislocation.  Patient will need day of surgery labs and evaluation.  Addendum 09/30/21: I did ultimately receive limited records after faxing requests. One OV note from 03/20/21, seen by Madaline Brilliant, FNP, visit for acute thumb pain. The note does does state that pt is taking Plavix as prescribed and denies any chest pain. Also states he was lost to cardiology followup. No EKG or cardiac studies received.   Cath 01/26/2013: IMPRESSIONS:  1. Widely patent right coronary without evidence of in-stent restenosis or progression of disease. The circumflex artery contains a 50% mid vessel stenosis .   2. 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 The patient's current symptoms could be related to anterior wall ischemia although the LAD appears to have reasonable collateralization.   3. Inability to cross the total occlusion with guidewire as noted above.   4. Overall normal left ventricular function with EF of 60%   5. Nonsustained SVT, asymptomatic while in  cath lab.     RECOMMENDATION:  1. Resume medical therapy   2. Consider 30 day ambulatory monitoring rule out atrial fibrillation. Perhaps the patient had a nonsustained episode of atrial fibrillation that accounts for his current symptoms.   Zannie Cove [facility] Short Stay Center/Anesthesiology Phone [phone] 09/29/2021 3:44 PM

## 2021-09-30 ENCOUNTER — Ambulatory Visit (HOSPITAL_BASED_OUTPATIENT_CLINIC_OR_DEPARTMENT_OTHER): Payer: 59 | Admitting: Orthopaedic Surgery

## 2021-09-30 ENCOUNTER — Ambulatory Visit (HOSPITAL_COMMUNITY): Payer: 59 | Admitting: Physician Assistant

## 2021-09-30 ENCOUNTER — Other Ambulatory Visit: Payer: Self-pay

## 2021-09-30 ENCOUNTER — Ambulatory Visit (HOSPITAL_COMMUNITY): Admission: RE | Admit: 2021-09-30 | Payer: 59 | Source: Home / Self Care | Admitting: Orthopedic Surgery

## 2021-09-30 ENCOUNTER — Encounter (HOSPITAL_COMMUNITY)
Admission: RE | Disposition: A | Discharge status: Home / Self Care | Payer: Self-pay | Source: Home / Self Care | Attending: Orthopaedic Surgery

## 2021-09-30 ENCOUNTER — Ambulatory Visit (HOSPITAL_COMMUNITY)
Admission: RE | Admit: 2021-09-30 | Discharge: 2021-09-30 | Disposition: A | Discharge status: Home / Self Care | Payer: 59 | Attending: Orthopaedic Surgery | Admitting: Orthopaedic Surgery

## 2021-09-30 ENCOUNTER — Ambulatory Visit (HOSPITAL_COMMUNITY): Payer: 59

## 2021-09-30 ENCOUNTER — Encounter (HOSPITAL_COMMUNITY): Admission: RE | Payer: Self-pay | Source: Home / Self Care

## 2021-09-30 ENCOUNTER — Encounter (HOSPITAL_COMMUNITY): Payer: Self-pay | Admitting: Orthopaedic Surgery

## 2021-09-30 DIAGNOSIS — K219 Gastro-esophageal reflux disease without esophagitis: Secondary | ICD-10-CM | POA: Diagnosis not present

## 2021-09-30 DIAGNOSIS — W19XXXA Unspecified fall, initial encounter: Secondary | ICD-10-CM | POA: Insufficient documentation

## 2021-09-30 DIAGNOSIS — Z955 Presence of coronary angioplasty implant and graft: Secondary | ICD-10-CM | POA: Diagnosis not present

## 2021-09-30 DIAGNOSIS — Z419 Encounter for procedure for purposes other than remedying health state, unspecified: Secondary | ICD-10-CM

## 2021-09-30 DIAGNOSIS — S82841S Displaced bimalleolar fracture of right lower leg, sequela: Secondary | ICD-10-CM | POA: Diagnosis not present

## 2021-09-30 DIAGNOSIS — I2510 Atherosclerotic heart disease of native coronary artery without angina pectoris: Secondary | ICD-10-CM | POA: Diagnosis not present

## 2021-09-30 DIAGNOSIS — Z20822 Contact with and (suspected) exposure to covid-19: Secondary | ICD-10-CM | POA: Insufficient documentation

## 2021-09-30 DIAGNOSIS — Z7902 Long term (current) use of antithrombotics/antiplatelets: Secondary | ICD-10-CM | POA: Insufficient documentation

## 2021-09-30 DIAGNOSIS — Z79899 Other long term (current) drug therapy: Secondary | ICD-10-CM | POA: Insufficient documentation

## 2021-09-30 DIAGNOSIS — I10 Essential (primary) hypertension: Secondary | ICD-10-CM | POA: Insufficient documentation

## 2021-09-30 DIAGNOSIS — S82841A Displaced bimalleolar fracture of right lower leg, initial encounter for closed fracture: Secondary | ICD-10-CM | POA: Insufficient documentation

## 2021-09-30 DIAGNOSIS — I252 Old myocardial infarction: Secondary | ICD-10-CM | POA: Insufficient documentation

## 2021-09-30 DIAGNOSIS — E785 Hyperlipidemia, unspecified: Secondary | ICD-10-CM | POA: Diagnosis not present

## 2021-09-30 HISTORY — PX: ORIF ANKLE FRACTURE: SHX5408

## 2021-09-30 LAB — SARS CORONAVIRUS 2 BY RT PCR (HOSPITAL ORDER, PERFORMED IN ~~LOC~~ HOSPITAL LAB): SARS Coronavirus 2: NEGATIVE

## 2021-09-30 SURGERY — OPEN REDUCTION INTERNAL FIXATION (ORIF) ANKLE FRACTURE
Anesthesia: Choice | Site: Ankle | Laterality: Right

## 2021-09-30 SURGERY — OPEN REDUCTION INTERNAL FIXATION (ORIF) ANKLE FRACTURE
Anesthesia: General | Site: Ankle | Laterality: Right

## 2021-09-30 MED ORDER — OXYCODONE HCL 5 MG PO TABS: 5.0000 mg | ORAL_TABLET | Freq: Once | ORAL | Status: DC | PRN

## 2021-09-30 MED ORDER — ACETAMINOPHEN 500 MG PO TABS
1000.0000 mg | ORAL_TABLET | Freq: Once | ORAL | Status: AC
  Administered 2021-09-30: 14:00:00 1000 mg via ORAL
  Filled 2021-09-30: qty 2

## 2021-09-30 MED ORDER — LIDOCAINE 2% (20 MG/ML) 5 ML SYRINGE
INTRAMUSCULAR | Status: DC | PRN
  Administered 2021-09-30: 80 mg via INTRAVENOUS

## 2021-09-30 MED ORDER — ONDANSETRON HCL 4 MG/2ML IJ SOLN
INTRAMUSCULAR | Status: AC
  Filled 2021-09-30: qty 2

## 2021-09-30 MED ORDER — MIDAZOLAM HCL 2 MG/2ML IJ SOLN
INTRAMUSCULAR | Status: AC
  Filled 2021-09-30: qty 2

## 2021-09-30 MED ORDER — TRANEXAMIC ACID-NACL 1000-0.7 MG/100ML-% IV SOLN
1000.0000 mg | INTRAVENOUS | Status: AC
  Administered 2021-09-30: 16:00:00 1000 mg via INTRAVENOUS
  Filled 2021-09-30: qty 100

## 2021-09-30 MED ORDER — MIDAZOLAM HCL 2 MG/2ML IJ SOLN
INTRAMUSCULAR | Status: AC
  Administered 2021-09-30: 16:00:00 2 mg via INTRAVENOUS
  Filled 2021-09-30: qty 2

## 2021-09-30 MED ORDER — EPHEDRINE 5 MG/ML INJ
INTRAVENOUS | Status: AC
  Filled 2021-09-30: qty 5

## 2021-09-30 MED ORDER — EPHEDRINE SULFATE-NACL 50-0.9 MG/10ML-% IV SOSY
PREFILLED_SYRINGE | INTRAVENOUS | Status: DC | PRN
  Administered 2021-09-30 (×3): 10 mg via INTRAVENOUS

## 2021-09-30 MED ORDER — BUPIVACAINE HCL (PF) 0.5 % IJ SOLN
INTRAMUSCULAR | Status: DC | PRN
  Administered 2021-09-30: 15 mL via PERINEURAL
  Administered 2021-09-30: 25 mL via PERINEURAL

## 2021-09-30 MED ORDER — ONDANSETRON HCL 4 MG/2ML IJ SOLN
INTRAMUSCULAR | Status: DC | PRN
Start: 2021-09-30 — End: 2021-09-30
  Administered 2021-09-30 (×2): 4 mg via INTRAVENOUS

## 2021-09-30 MED ORDER — PROPOFOL 10 MG/ML IV BOLUS
INTRAVENOUS | Status: AC
  Filled 2021-09-30: qty 20

## 2021-09-30 MED ORDER — ONDANSETRON HCL 4 MG/2ML IJ SOLN: 4.0000 mg | Freq: Once | INTRAMUSCULAR | Status: DC | PRN

## 2021-09-30 MED ORDER — IBUPROFEN 200 MG PO TABS
200.0000 mg | ORAL_TABLET | Freq: Four times a day (QID) | ORAL | Status: DC | PRN
  Filled 2021-09-30: qty 2

## 2021-09-30 MED ORDER — ACETAMINOPHEN 500 MG PO TABS: 500.0000 mg | ORAL_TABLET | Freq: Three times a day (TID) | ORAL | 0 refills | Status: AC

## 2021-09-30 MED ORDER — FENTANYL CITRATE (PF) 250 MCG/5ML IJ SOLN
INTRAMUSCULAR | Status: AC
  Filled 2021-09-30: qty 5

## 2021-09-30 MED ORDER — CEFAZOLIN SODIUM-DEXTROSE 2-4 GM/100ML-% IV SOLN
2.0000 g | INTRAVENOUS | Status: AC
  Administered 2021-09-30: 16:00:00 2 g via INTRAVENOUS
  Filled 2021-09-30: qty 100

## 2021-09-30 MED ORDER — IBUPROFEN 100 MG/5ML PO SUSP
200.0000 mg | Freq: Four times a day (QID) | ORAL | Status: DC | PRN
  Filled 2021-09-30: qty 20

## 2021-09-30 MED ORDER — LACTATED RINGERS IV SOLN: INTRAVENOUS | Status: DC

## 2021-09-30 MED ORDER — OXYCODONE HCL 5 MG/5ML PO SOLN: 5.0000 mg | Freq: Once | ORAL | Status: DC | PRN

## 2021-09-30 MED ORDER — MIDAZOLAM HCL 2 MG/2ML IJ SOLN
INTRAMUSCULAR | Status: DC | PRN
  Administered 2021-09-30: 2 mg via INTRAVENOUS

## 2021-09-30 MED ORDER — GABAPENTIN 300 MG PO CAPS
300.0000 mg | ORAL_CAPSULE | Freq: Once | ORAL | Status: AC
  Administered 2021-09-30: 14:00:00 300 mg via ORAL
  Filled 2021-09-30: qty 1

## 2021-09-30 MED ORDER — MEPERIDINE HCL 25 MG/ML IJ SOLN: 6.2500 mg | INTRAMUSCULAR | Status: DC | PRN

## 2021-09-30 MED ORDER — MIDAZOLAM HCL 2 MG/2ML IJ SOLN: 2.0000 mg | Freq: Once | INTRAMUSCULAR | Status: AC

## 2021-09-30 MED ORDER — CHLORHEXIDINE GLUCONATE 0.12 % MT SOLN
OROMUCOSAL | Status: AC
  Administered 2021-09-30: 14:00:00 15 mL via OROMUCOSAL
  Filled 2021-09-30: qty 15

## 2021-09-30 MED ORDER — FENTANYL CITRATE (PF) 250 MCG/5ML IJ SOLN
INTRAMUSCULAR | Status: DC | PRN
  Administered 2021-09-30: 50 ug via INTRAVENOUS

## 2021-09-30 MED ORDER — LACTATED RINGERS IV SOLN: INTRAVENOUS | Status: DC | PRN

## 2021-09-30 MED ORDER — CHLORHEXIDINE GLUCONATE 0.12 % MT SOLN
15.0000 mL | OROMUCOSAL | Status: AC
  Filled 2021-09-30: qty 15

## 2021-09-30 MED ORDER — 0.9 % SODIUM CHLORIDE (POUR BTL) OPTIME
TOPICAL | Status: DC | PRN
  Administered 2021-09-30: 16:00:00 1000 mL

## 2021-09-30 MED ORDER — LIDOCAINE 2% (20 MG/ML) 5 ML SYRINGE
INTRAMUSCULAR | Status: AC
  Filled 2021-09-30: qty 5

## 2021-09-30 MED ORDER — FENTANYL CITRATE (PF) 100 MCG/2ML IJ SOLN: 25.0000 ug | INTRAMUSCULAR | Status: DC | PRN

## 2021-09-30 MED ORDER — IBUPROFEN 800 MG PO TABS: 800.0000 mg | ORAL_TABLET | Freq: Three times a day (TID) | ORAL | 0 refills | Status: AC

## 2021-09-30 MED ORDER — FENTANYL CITRATE (PF) 100 MCG/2ML IJ SOLN
INTRAMUSCULAR | Status: AC
  Administered 2021-09-30: 16:00:00 100 ug via INTRAVENOUS
  Filled 2021-09-30: qty 2

## 2021-09-30 MED ORDER — FENTANYL CITRATE (PF) 100 MCG/2ML IJ SOLN: 100.0000 ug | Freq: Once | INTRAMUSCULAR | Status: AC

## 2021-09-30 MED ORDER — ONDANSETRON 8 MG PO TBDP: 8.0000 mg | ORAL_TABLET | Freq: Three times a day (TID) | ORAL | 0 refills | Status: AC | PRN

## 2021-09-30 MED ORDER — DEXMEDETOMIDINE (PRECEDEX) IN NS 20 MCG/5ML (4 MCG/ML) IV SYRINGE
PREFILLED_SYRINGE | INTRAVENOUS | Status: DC | PRN
  Administered 2021-09-30: 8 ug via INTRAVENOUS

## 2021-09-30 MED ORDER — OXYCODONE HCL 5 MG PO CAPS: 5.0000 mg | ORAL_CAPSULE | ORAL | 0 refills | Status: AC | PRN

## 2021-09-30 MED ORDER — ACETAMINOPHEN 10 MG/ML IV SOLN: 1000.0000 mg | Freq: Once | INTRAVENOUS | Status: DC | PRN

## 2021-09-30 MED ORDER — PROPOFOL 10 MG/ML IV BOLUS
INTRAVENOUS | Status: DC | PRN
Start: 2021-09-30 — End: 2021-09-30
  Administered 2021-09-30: 150 mg via INTRAVENOUS

## 2021-09-30 MED ORDER — DEXAMETHASONE SODIUM PHOSPHATE 10 MG/ML IJ SOLN
INTRAMUSCULAR | Status: DC | PRN
  Administered 2021-09-30: 5 mg
  Administered 2021-09-30: 7 mg
  Administered 2021-09-30: 3 mg

## 2021-09-30 MED ORDER — DEXAMETHASONE SODIUM PHOSPHATE 10 MG/ML IJ SOLN
INTRAMUSCULAR | Status: AC
  Filled 2021-09-30: qty 1

## 2021-09-30 SURGICAL SUPPLY — 51 items
BAG COUNTER SPONGE SURGICOUNT (BAG) ×2 IMPLANT
BAG SURGICOUNT SPONGE COUNTING (BAG) ×1
BANDAGE ESMARK 6X9 LF (GAUZE/BANDAGES/DRESSINGS) ×1 IMPLANT
BIT DRILL QC 2.5MM SHRT EVO SM (DRILL) IMPLANT
BNDG ELASTIC 4X5.8 VLCR NS LF (GAUZE/BANDAGES/DRESSINGS) ×2 IMPLANT
BNDG ELASTIC 4X5.8 VLCR STR LF (GAUZE/BANDAGES/DRESSINGS) ×3 IMPLANT
BNDG ESMARK 6X9 LF (GAUZE/BANDAGES/DRESSINGS) ×3
BNDG GAUZE ELAST 4 BULKY (GAUZE/BANDAGES/DRESSINGS) ×2 IMPLANT
COTTON STERILE ROLL (GAUZE/BANDAGES/DRESSINGS) ×1 IMPLANT
COVER SURGICAL LIGHT HANDLE (MISCELLANEOUS) ×3 IMPLANT
CUFF TOURN SGL QUICK 34 (TOURNIQUET CUFF) ×2
CUFF TRNQT CYL 34X4.125X (TOURNIQUET CUFF) IMPLANT
DRAPE OEC MINIVIEW 54X84 (DRAPES) ×3 IMPLANT
DRAPE SURG 17X23 STRL (DRAPES) ×3 IMPLANT
DRILL QC 2.5MM SHORT EVOS SM (DRILL) ×3
DRSG PAD ABDOMINAL 8X10 ST (GAUZE/BANDAGES/DRESSINGS) ×3 IMPLANT
ELECT REM PT RETURN 9FT ADLT (ELECTROSURGICAL) ×3
ELECTRODE REM PT RTRN 9FT ADLT (ELECTROSURGICAL) ×1 IMPLANT
GAUZE SPONGE 4X4 12PLY STRL (GAUZE/BANDAGES/DRESSINGS) ×5 IMPLANT
GAUZE XEROFORM 1X8 LF (GAUZE/BANDAGES/DRESSINGS) ×3 IMPLANT
GLOVE SURG ENC TEXT LTX SZ8 (GLOVE) ×3 IMPLANT
GLOVE SURG MICRO LTX SZ8 (GLOVE) ×3 IMPLANT
GOWN STRL REUS W/ TWL LRG LVL3 (GOWN DISPOSABLE) ×3 IMPLANT
GOWN STRL REUS W/ TWL XL LVL3 (GOWN DISPOSABLE) ×3 IMPLANT
GOWN STRL REUS W/TWL LRG LVL3 (GOWN DISPOSABLE) ×6
GOWN STRL REUS W/TWL XL LVL3 (GOWN DISPOSABLE) ×6
KIT BASIN OR (CUSTOM PROCEDURE TRAY) ×3 IMPLANT
KIT TURNOVER KIT B (KITS) ×3 IMPLANT
MANIFOLD NEPTUNE II (INSTRUMENTS) ×3 IMPLANT
NS IRRIG 1000ML POUR BTL (IV SOLUTION) ×3 IMPLANT
PACK ORTHO EXTREMITY (CUSTOM PROCEDURE TRAY) ×3 IMPLANT
PAD ARMBOARD 7.5X6 YLW CONV (MISCELLANEOUS) ×6 IMPLANT
PAD CAST 4YDX4 CTTN HI CHSV (CAST SUPPLIES) ×1 IMPLANT
PADDING CAST COTTON 4X4 STRL (CAST SUPPLIES)
PLATE LOCK EVOS 3.5X94 8H (Plate) ×2 IMPLANT
SCREW CORT 3.5X14 ST EVOS (Screw) ×2 IMPLANT
SCREW CORT 3.5X16 ST EVOS (Screw) ×2 IMPLANT
SCREW CORT EVOS ST 3.5X12 (Screw) ×8 IMPLANT
SPONGE T-LAP 18X18 ~~LOC~~+RFID (SPONGE) ×2 IMPLANT
STAPLER VISISTAT 35W (STAPLE) ×3 IMPLANT
SUCTION FRAZIER HANDLE 10FR (MISCELLANEOUS) ×2
SUCTION TUBE FRAZIER 10FR DISP (MISCELLANEOUS) ×1 IMPLANT
SUT ETHILON 3 0 PS 1 (SUTURE) ×4 IMPLANT
SUT MNCRL AB 3-0 PS2 18 (SUTURE) ×2 IMPLANT
SUT VIC AB 0 CT1 27 (SUTURE) ×2
SUT VIC AB 0 CT1 27XBRD ANBCTR (SUTURE) IMPLANT
TOWEL GREEN STERILE (TOWEL DISPOSABLE) ×3 IMPLANT
TOWEL GREEN STERILE FF (TOWEL DISPOSABLE) ×3 IMPLANT
TUBE CONNECTING 12'X1/4 (SUCTIONS) ×1
TUBE CONNECTING 12X1/4 (SUCTIONS) ×2 IMPLANT
WATER STERILE IRR 1000ML POUR (IV SOLUTION) ×3 IMPLANT

## 2021-09-30 NOTE — Op Note (Signed)
Date of Surgery: 09/30/2021  INDICATIONS: Mr. Charles Benjamin is a 66 y.o.-year-old male with a displaced right distal fibular fracture with significant subluxation which was subsequently closed reduced in the emergency room.  The risk and benefits of the procedure with discussed in detail and documented in the pre-operative evaluation.  PREOPERATIVE DIAGNOSIS: 1.  Right displaced distal fibula fracture and medial malleolar avulsion, bimalleolar type fracture  POSTOPERATIVE DIAGNOSIS: Same.  PROCEDURE: 1.  Open treatment and reduction of distal fibula fracture  SURGEON: Charles Deeds MD  ASSISTANT: Kerby Less, ATC; necessary for the timely completion of procedure and due to complexity of procedure.  ANESTHESIA:  general plus peripheral nerve block  IV FLUIDS AND URINE: See anesthesia record.  ANTIBIOTICS: Ancef 2 g  ESTIMATED BLOOD LOSS: 25 mL.  IMPLANTS:  Implant Name Type Inv. Item Serial No. Manufacturer Lot No. LRB No. Used Action  SCREW CORT EVOS ST 3.5X12 - [id number] Screw SCREW CORT EVOS ST 3.5X12  [employer]  Right 4 Implanted  SCREW CORT 3.5X14 ST EVOS - [id number] Screw SCREW CORT 3.5X14 ST EVOS  [employer]  Right 1 Implanted  SCREW CORT 3.5X16 ST EVOS - [id number] Screw SCREW CORT 3.5X16 ST EVOS  [employer]  Right 1 Implanted  PLATE LOCK EVOS 3.5X94 8H - [id number] Plate PLATE LOCK EVOS 3.5X94 8H  [employer]  Right 1 Implanted    DRAINS: None  CULTURES: None  COMPLICATIONS: none  DESCRIPTION OF PROCEDURE:  Patient was identified in the preoperative holding area.  The correct site was marked according to universal protocol with nursing.  Anesthesia performed a peripheral nerve block.  He is subsequently taken back to the operating room.  Anesthesia was induced.  He was prepped and draped in the usual sterile fashion.  All bony prominences were well-padded.  Final timeout was performed.  Ancef was given  1 hour prior to skin incision.  We began with an approach to the lateral fibula.  15 blade was used to incise through skin.  Metzenbaums were then used to dissect level by level in order to avoid the peroneal nerve.  Dissection was carried down to the level of the fracture site.  At this point 15 blade was used to free up the fracture edges as well as a small ronguer in order to remove the callus.  At this time the fracture site was thoroughly irrigated.  A lobster clamp was then used to reduce the fracture.  This showed that the fibula was out to length with no medial clear space widening.  As result a 8 hole plate was then positioned in a posterior lateral position.  The buttress screw was placed first.  Then placed additional 2 proximal screws.  3 screws were placed distally in the fibula as well as one interposition fragment across the fracture site.  This achieved excellent purchase and reduction.  Final fluoroscopy obtained on the AP and lateral showed anatomic reduction.  Stress examination showed no medial clear space widening.  The lateral fibula was then irrigated and closed in layers of 0 Vicryl 3-0 Monocryl and 3-0 nylon.  Soft dressing with gauze cast padding and an Ace wrap was applied.  Cam boot was applied.   Kerby Less ATC was necessary for opening, closing, retracting, limb positioning and overall facilitation and timely completion of the procedure.     POSTOPERATIVE PLAN: The patient will be nonweightbearing for a total of 2 weeks on the right  ankle.  Following this I will plan to advance him to weightbearing as tolerated.  He will see me back in office in 2 weeks for reassessment.  At that time we will plan to begin physical therapy on an outpatient basis and likely an external referral as he currently lives in [facility]  Charles Deeds, MD 4:58 PM

## 2021-09-30 NOTE — Progress Notes (Signed)
Patient has a ride home.  Ex-Wife Al Corpus cell [phone] is waiting at hospital during patient's surgery.  He will be discharged home with Ex-Wife Al Corpus and taken to Mr. Simona Huh Terry's house [phone] for the next 24 hours after surgery.

## 2021-09-30 NOTE — H&P (Signed)
ORTHOPAEDIC CONSULTATION  REQUESTING PHYSICIAN: Vanetta Mulders, MD  Chief Complaint: Right ankle pain  HPI: Charles ALVELO Sr. is a 66 y.o. male who presents with a right bimalleolar ankle fracture after a fall at home.  Presents today for follow-up and definitive fixation.  He does live in [place] and had his ex-wife take him in.  He is on Plavix although this was continued 5 days prior  Past Medical History:  Diagnosis Date   Coronary artery disease    Inferior myocardial infarction-RCA DES, stent 01/02/12, CTO of prox-mid LAD   Gastro - esophageal reflux    Hyperlipidemia    Hypertension    Past Surgical History:  Procedure Laterality Date   CORONARY STENT PLACEMENT     LEFT HEART CATH N/A 01/02/2012   Procedure: LEFT HEART CATH;  Surgeon: Sinclair Grooms, MD;  Location[facility] CATH LAB;  Service: Cardiovascular;  Laterality: N/A;   LEFT HEART CATHETERIZATION WITH CORONARY ANGIOGRAM Right 01/26/2013   Procedure: LEFT HEART CATHETERIZATION WITH CORONARY ANGIOGRAM Possible PCI;  Surgeon: Sinclair Grooms, MD;  Location: [facility] CATH LAB;  Service: Cardiovascular;  Laterality: Right;   Social History   Socioeconomic History   Marital status: Single    Spouse name: Not on file   Number of children: Not on file   Years of education: Not on file   Highest education level: Not on file  Occupational History   Not on file  Tobacco Use   Smoking status: Never   Smokeless tobacco: Not on file  Substance and Sexual Activity   Alcohol use: No   Drug use: No   Sexual activity: Not on file  Other Topics Concern   Not on file  Social History Narrative   Not on file   Social Determinants of Health   Financial Resource Strain: Not on file  Food Insecurity: Not on file  Transportation Needs: Not on file  Physical Activity: Not on file  Stress: Not on file  Social Connections: Not on file   History reviewed. No pertinent family history. - negative except otherwise stated in the  family history section Allergies  Allergen Reactions   Shrimp [Shellfish Allergy] Anaphylaxis   Pravastatin Other (See Comments)    Muscle pain and weakness   Prior to Admission medications   Medication Sig Start Date End Date Taking? Authorizing Provider  acetaminophen (TYLENOL) 500 MG tablet Take 1,000 mg by mouth every 6 (six) hours as needed for moderate pain or mild pain.   Yes [provider]  aspirin 325 MG EC tablet Take 325 mg by mouth daily.   Yes [provider]  atorvastatin (LIPITOR) 20 MG tablet Take 20 mg by mouth daily. 04/24/20  Yes [provider]  Carboxymethylcellul-Glycerin (CLEAR EYES FOR DRY EYES) 1-0.25 % SOLN Place 1 drop into both eyes daily as needed (dry eye).   Yes [provider]  clopidogrel (PLAVIX) 75 MG tablet Take 1 tablet (75 mg total) by mouth daily with breakfast. 01/28/13  Yes Belva Crome, MD  doxazosin (CARDURA) 8 MG tablet Take 8 mg by mouth daily. 04/24/20  Yes [provider]  ibuprofen (ADVIL) 800 MG tablet Take 1 tablet (800 mg total) by mouth every 8 (eight) hours as needed for mild pain. Patient not taking: Reported on 09/25/2021 07/26/21   Ward, Delice Bison, DO  nitroGLYCERIN (NITROSTAT) 0.4 MG SL tablet Place 1 tablet (0.4 mg total) under the tongue every 5 (five) minutes x  3 doses as needed for chest pain. 01/27/13 09/25/21  Belva Crome, MD  ondansetron (ZOFRAN ODT) 4 MG disintegrating tablet Take 1 tablet (4 mg total) by mouth every 6 (six) hours as needed for nausea or vomiting. Patient not taking: Reported on 09/25/2021 07/26/21   Ward, Delice Bison, DO   No results found.   Positive ROS: All other systems have been reviewed and were otherwise negative with the exception of those mentioned in the HPI and as above.  Physical Exam: General: No acute distress Cardiovascular: No pedal edema Respiratory: No cyanosis, no use of accessory musculature GI: No organomegaly, abdomen is soft and  non-tender Skin: No lesions in the area of chief complaint Neurologic: Sensation intact distally Psychiatric: Patient is at baseline mood and affect Lymphatic: No axillary or cervical lymphadenopathy  MUSCULOSKELETAL:  Right ankle splinted.  Exposed toes are clean dry and intact with less than 2-second cap refill.  He is able to dorsiflex and plantar flex all exposed toes  Independent Imaging Review: X-ray 3 views right ankle: Right bimalleolar ankle fracture status post closed reduction  Assessment: 66 year old male with right bimalleolar ankle fracture status post closed reduction.  Given the fact that he did have a subluxation/dislocation event requiring a closed reduction I advised that I do agree that surgical intervention is the best option for him.  He understands this and would like to proceed  Plan: Plan for right ankle open reduction internal fixation  Thank you for the consult and the opportunity to see [name], MD [facility] 3:33 PM

## 2021-09-30 NOTE — Anesthesia Procedure Notes (Signed)
Procedure Name: LMA Insertion Date/Time: 09/30/2021 3:53 PM Performed by: Ervin Knack, CRNA Pre-anesthesia Checklist: Patient identified, Emergency Drugs available, Suction available and Patient being monitored Patient Re-evaluated:Patient Re-evaluated prior to induction Oxygen Delivery Method: Circle system utilized Preoxygenation: Pre-oxygenation with 100% oxygen Induction Type: IV induction LMA: LMA inserted LMA Size: 5.0 Number of attempts: 1 Placement Confirmation: positive ETCO2 Dental Injury: Teeth and Oropharynx as per pre-operative assessment

## 2021-09-30 NOTE — Discharge Instructions (Signed)
° ° °   Discharge Instructions    Attending Surgeon: Huel Cote, MD Office Phone Number: [phone]   Diagnosis and Procedures:    Surgeries Performed: Right ankle open reduction internal fixation  Discharge Plan:    Diet: Resume usual diet. Begin with light or bland foods.  Drink plenty of fluids.  Activity:  Non-weightbearing, utilizing crutches, for a total of 2 weeks.  Please keep your brace locked until follow-up. You are advised to go home directly from the hospital or surgical center. Restrict your activities.  GENERAL INSTRUCTIONS: 1.  Keep your surgical site elevated above your heart for at least 5-7 days or longer to prevent swelling. This will improve your comfort and your overall recovery following surgery.     2. Please call Dr. Serena Croissant office at [phone] with questions Monday-Friday during business hours. If no one answers, please leave a message and someone should get back to the patient within 24 hours. For emergencies please call 911 or proceed to the emergency room.   3. Patient to notify surgical team if experiences any of the following: Bowel/Bladder dysfunction, uncontrolled pain, nerve/muscle weakness, incision with increased drainage or redness, nausea/vomiting and Fever greater than 101.0 F.  Be alert for signs of infection including redness, streaking, odor, fever or chills. Be alert for excessive pain or bleeding and notify your surgeon immediately.  WOUND INSTRUCTIONS:   Leave your dressing/cast/splint in place until your post operative visit.  Keep it clean and dry.  Always keep the incision clean and dry until the staples/sutures are removed. If there is no drainage from the incision you should keep it open to air. If there is drainage from the incision you must keep it covered at all times until the drainage stops  Do not soak in a bath tub, hot tub, pool, lake or other body of water until 21 days after your surgery and your incision is  completely dry and healed.  If you have removable sutures (or staples) they must be removed 10-14 days (unless otherwise instructed) from the day of your surgery.     1)  Elevate the extremity as much as possible.  2)  Keep the dressing clean and dry.  3)  Please call us if the dressing becomes wet or dirty.  4)  If you are experiencing worsening pain or worsening swelling, please call.     MEDICATIONS: Resume all previous home medications at the previous prescribed dose and frequency unless otherwise noted Start taking the  pain medications on an as-needed basis as prescribed  Please taper down pain medication over the next week following surgery.  Ideally you should not require a refill of any narcotic pain medication.  Take pain medication with food to minimize nausea. In addition to the prescribed pain medication, you may take over-the-counter pain relievers such as Tylenol.  Do NOT take additional tylenol if your pain medication already has tylenol in it.  Please resume home aspirin and Plavix on postop day 1      FOLLOWUP INSTRUCTIONS: 1. Follow up at the Physical Therapy Clinic 3-4 days following surgery. This appointment should be scheduled unless other arrangements have been made.The Physical Therapy scheduling number is [phone] if an appointment has not already been arranged.  2. Contact Dr. Serena Croissant office during office hours at [phone] or the practice after hours line at [phone] for non-emergencies. For medical emergencies call 911.   Discharge Location: Home

## 2021-09-30 NOTE — Interval H&P Note (Signed)
History and Physical Interval Note:  09/30/2021 3:37 PM  Charles Benjamin Sr.  has presented today for surgery, with the diagnosis of Right Ankle.  The various methods of treatment have been discussed with the patient and family. After consideration of risks, benefits and other options for treatment, the patient has consented to  Procedure(s): OPEN REDUCTION INTERNAL FIXATION (ORIF) ANKLE FRACTURE (Right) as a surgical intervention.  The patient's history has been reviewed, patient examined, no change in status, stable for surgery.  I have reviewed the patient's chart and labs.  Questions were answered to the patient's satisfaction.     Huel Cote

## 2021-09-30 NOTE — Brief Op Note (Signed)
° °  Brief Op Note  Date of Surgery: 09/30/2021  Preoperative Diagnosis: Right Ankle fracture  Postoperative Diagnosis: same  Procedure: Procedure(s): OPEN REDUCTION INTERNAL FIXATION (ORIF) ANKLE FRACTURE  Implants: Implant Name Type Inv. Item Serial No. Manufacturer Lot No. LRB No. Used Action  SCREW CORT EVOS ST 3.5X12 - [id number] Screw SCREW CORT EVOS ST 3.5X12  [employer]  Right 4 Implanted  SCREW CORT 3.5X14 ST EVOS - [id number] Screw SCREW CORT 3.5X14 ST EVOS  [employer]  Right 1 Implanted  SCREW CORT 3.5X16 ST EVOS - [id number] Screw SCREW CORT 3.5X16 ST EVOS  [employer]  Right 1 Implanted  PLATE LOCK EVOS 3.5X94 8H - [id number] Plate PLATE LOCK EVOS 3.5X94 8H  [employer]  Right 1 Implanted    Surgeons: Surgeon(s): Huel Cote, MD  Anesthesia: General    Estimated Blood Loss: See anesthesia record  Complications: None  Condition to PACU: Stable  Benancio Deeds, MD 09/30/2021 4:57 PM

## 2021-09-30 NOTE — Anesthesia Postprocedure Evaluation (Signed)
Anesthesia Post Note  Patient: Charles KRASINSKI Sr.  Procedure(s) Performed: OPEN REDUCTION INTERNAL FIXATION (ORIF) ANKLE FRACTURE (Right: Ankle)     Patient location during evaluation: PACU Anesthesia Type: General Level of consciousness: sedated Pain management: pain level controlled Vital Signs Assessment: post-procedure vital signs reviewed and stable Respiratory status: spontaneous breathing and respiratory function stable Cardiovascular status: stable Postop Assessment: no apparent nausea or vomiting Anesthetic complications: no   No notable events documented.  Last Vitals:  Vitals:   09/30/21 1720 09/30/21 1735  BP: (!) 145/74 (!) 150/78  Pulse: 72 63  Resp: 13 11  Temp:  36.8 C  SpO2: 95% 95%    Last Pain:  Vitals:   09/30/21 1735  TempSrc:   PainSc: 0-No pain                 Jaydan Chretien DANIEL

## 2021-09-30 NOTE — Progress Notes (Signed)
Patient states that he does not have anyone to stay with him after surgery.  Dr. Steward Drone aware and will keep patient over night.     Update at 1500: Patient has a ride home and has a place to stay.

## 2021-09-30 NOTE — Anesthesia Procedure Notes (Signed)
Anesthesia Regional Block: Popliteal block   Pre-Anesthetic Checklist: , timeout performed,  Correct Patient, Correct Site, Correct Laterality,  Correct Procedure, Correct Position, site marked,  Risks and benefits discussed,  Surgical consent,  Pre-op evaluation,  At surgeon's request and post-op pain management  Laterality: Right  Prep: chloraprep       Needles:  Injection technique: Single-shot  Needle Type: Echogenic Stimulator Needle          Additional Needles:   Procedures:,,,, ultrasound used (permanent image in chart),,    Narrative:  Start time: 09/30/2021 3:28 PM End time: 09/30/2021 3:38 PM Injection made incrementally with aspirations every 5 mL.  Performed by: Personally  Anesthesiologist: Heather Roberts, MD  Additional Notes: A functioning IV was confirmed and monitors were applied.  Sterile prep and drape, hand hygiene and sterile gloves were used.  Negative aspiration and test dose prior to incremental administration of local anesthetic. The patient tolerated the procedure well.Ultrasound  guidance: relevant anatomy identified, needle position confirmed, local anesthetic spread visualized around nerve(s), vascular puncture avoided.  Image printed for medical record.

## 2021-09-30 NOTE — Transfer of Care (Signed)
Immediate Anesthesia Transfer of Care Note  Patient: Charles Benjamin.  Procedure(s) Performed: OPEN REDUCTION INTERNAL FIXATION (ORIF) ANKLE FRACTURE (Right: Ankle)  Patient Location: PACU  Anesthesia Type:General and Regional  Level of Consciousness: awake, alert  and oriented  Airway & Oxygen Therapy: Patient Spontanous Breathing and Patient connected to nasal cannula oxygen  Post-op Assessment: Report given to RN and Post -op Vital signs reviewed and stable  Post vital signs: Reviewed and stable  Last Vitals:  Vitals Value Taken Time  BP 143/72 09/30/21 1703  Temp    Pulse 73 09/30/21 1704  Resp 13 09/30/21 1704  SpO2 95 % 09/30/21 1704  Vitals shown include unvalidated device data.  Last Pain:  Vitals:   09/30/21 1335  TempSrc:   PainSc: 0-No pain      Patients Stated Pain Goal: 0 (09/30/21 1335)  Complications: No notable events documented.

## 2021-10-01 ENCOUNTER — Telehealth (HOSPITAL_BASED_OUTPATIENT_CLINIC_OR_DEPARTMENT_OTHER): Payer: Self-pay | Admitting: Orthopaedic Surgery

## 2021-10-01 ENCOUNTER — Ambulatory Visit: Payer: 59 | Admitting: Orthopaedic Surgery

## 2021-10-01 ENCOUNTER — Other Ambulatory Visit (HOSPITAL_BASED_OUTPATIENT_CLINIC_OR_DEPARTMENT_OTHER): Payer: Self-pay | Admitting: Orthopaedic Surgery

## 2021-10-01 ENCOUNTER — Encounter (HOSPITAL_COMMUNITY): Payer: Self-pay | Admitting: Orthopaedic Surgery

## 2021-10-01 MED ORDER — HYDROCODONE-ACETAMINOPHEN 5-325 MG PO TABS: 1.0000 | ORAL_TABLET | Freq: Four times a day (QID) | ORAL | 0 refills | Status: AC | PRN

## 2021-10-01 NOTE — Telephone Encounter (Signed)
Pt called and said insurance did not approve oxycodone and would like a different Rx. I verbally told Dr. Sammuel Hines and he ordered hydrocodone to his pharmacy

## 2021-10-03 ENCOUNTER — Telehealth (HOSPITAL_BASED_OUTPATIENT_CLINIC_OR_DEPARTMENT_OTHER): Payer: Self-pay | Admitting: Orthopaedic Surgery

## 2021-10-03 NOTE — Telephone Encounter (Signed)
Pt called asking how to take post op medications. I informed him he can take Tramadol every 8 hours for breakthrough pain and intermittently take ibuprofen and Tylenol to stay ahead of pain. I also reminded hi to continue taking aspirin for 30 days post op

## 2021-10-13 ENCOUNTER — Other Ambulatory Visit (HOSPITAL_BASED_OUTPATIENT_CLINIC_OR_DEPARTMENT_OTHER): Payer: Self-pay | Admitting: Orthopaedic Surgery

## 2021-10-13 DIAGNOSIS — S82841A Displaced bimalleolar fracture of right lower leg, initial encounter for closed fracture: Secondary | ICD-10-CM

## 2021-10-15 ENCOUNTER — Other Ambulatory Visit (HOSPITAL_BASED_OUTPATIENT_CLINIC_OR_DEPARTMENT_OTHER): Payer: Self-pay

## 2021-10-15 ENCOUNTER — Ambulatory Visit (HOSPITAL_BASED_OUTPATIENT_CLINIC_OR_DEPARTMENT_OTHER)
Admission: RE | Admit: 2021-10-15 | Discharge: 2021-10-15 | Disposition: A | Discharge status: Home / Self Care | Payer: 59 | Source: Ambulatory Visit | Attending: Orthopaedic Surgery | Admitting: Orthopaedic Surgery

## 2021-10-15 ENCOUNTER — Other Ambulatory Visit: Payer: Self-pay

## 2021-10-15 ENCOUNTER — Ambulatory Visit (INDEPENDENT_AMBULATORY_CARE_PROVIDER_SITE_OTHER): Payer: 59 | Admitting: Orthopaedic Surgery

## 2021-10-15 DIAGNOSIS — S82841A Displaced bimalleolar fracture of right lower leg, initial encounter for closed fracture: Secondary | ICD-10-CM | POA: Insufficient documentation

## 2021-10-15 MED ORDER — IBUPROFEN 800 MG PO TABS
800.0000 mg | ORAL_TABLET | Freq: Three times a day (TID) | ORAL | 0 refills | Status: DC
  Filled 2021-10-15: qty 30, 10d supply, fill #0

## 2021-10-15 MED ORDER — IBUPROFEN 800 MG PO TABS
800.0000 mg | ORAL_TABLET | Freq: Three times a day (TID) | ORAL | 3 refills | Status: AC
  Filled 2021-10-15: qty 30, 10d supply, fill #0

## 2021-10-15 NOTE — Progress Notes (Signed)
Post Operative Evaluation    Procedure/Date of Surgery: Right ankle open reduction internal fixation done September 30, 2021  Interval History:  Presents today for follow-up evaluation status post right ankle open reduction internal fixation.  He has been compliant with nonweightbearing.  He has been in a boot.  Denies any symptoms of infection.  Has been taking Tylenol and ibuprofen.    PMH/PSH/Family History/Social History/Meds/Allergies:    Past Medical History:  Diagnosis Date   Coronary artery disease    Inferior myocardial infarction-RCA DES, stent 01/02/12, CTO of prox-mid LAD   Gastro - esophageal reflux    Hyperlipidemia    Hypertension    Past Surgical History:  Procedure Laterality Date   CORONARY STENT PLACEMENT     LEFT HEART CATH N/A 01/02/2012   Procedure: LEFT HEART CATH;  Surgeon: Lesleigh Noe, MD;  Location: [facility] CATH LAB;  Service: Cardiovascular;  Laterality: N/A;   LEFT HEART CATHETERIZATION WITH CORONARY ANGIOGRAM Right 01/26/2013   Procedure: LEFT HEART CATHETERIZATION WITH CORONARY ANGIOGRAM Possible PCI;  Surgeon: Lesleigh Noe, MD;  Location: [facility] CATH LAB;  Service: Cardiovascular;  Laterality: Right;   ORIF ANKLE FRACTURE Right 09/30/2021   Procedure: OPEN REDUCTION INTERNAL FIXATION (ORIF) ANKLE FRACTURE;  Surgeon: Huel Cote, MD;  Location: [facility] OR;  Service: Orthopedics;  Laterality: Right;   Social History   Socioeconomic History   Marital status: Single    Spouse name: Not on file   Number of children: Not on file   Years of education: Not on file   Highest education level: Not on file  Occupational History   Not on file  Tobacco Use   Smoking status: Never   Smokeless tobacco: Not on file  Substance and Sexual Activity   Alcohol use: No   Drug use: No   Sexual activity: Not on file  Other Topics Concern   Not on file  Social History Narrative   Not on file   Social Determinants of Health    Financial Resource Strain: Not on file  Food Insecurity: Not on file  Transportation Needs: Not on file  Physical Activity: Not on file  Stress: Not on file  Social Connections: Not on file   No family history on file. Allergies  Allergen Reactions   Shrimp [Shellfish Allergy] Anaphylaxis   Pravastatin Other (See Comments)    Muscle pain and weakness   Current Outpatient Medications  Medication Sig Dispense Refill   aspirin 325 MG EC tablet Take 325 mg by mouth daily.     atorvastatin (LIPITOR) 20 MG tablet Take 20 mg by mouth daily.     Carboxymethylcellul-Glycerin (CLEAR EYES FOR DRY EYES) 1-0.25 % SOLN Place 1 drop into both eyes daily as needed (dry eye).     clopidogrel (PLAVIX) 75 MG tablet Take 1 tablet (75 mg total) by mouth daily with breakfast.     doxazosin (CARDURA) 8 MG tablet Take 8 mg by mouth daily.     HYDROcodone-acetaminophen (NORCO/VICODIN) 5-325 MG tablet Take 1 tablet by mouth every 6 (six) hours as needed for moderate pain. 30 tablet 0   nitroGLYCERIN (NITROSTAT) 0.4 MG SL tablet Place 1 tablet (0.4 mg total) under the tongue every 5 (five) minutes x 3 doses as needed for chest pain. 25 tablet 5   ondansetron (  ZOFRAN ODT) 4 MG disintegrating tablet Take 1 tablet (4 mg total) by mouth every 6 (six) hours as needed for nausea or vomiting. (Patient not taking: Reported on 09/25/2021) 20 tablet 0   ondansetron (ZOFRAN-ODT) 8 MG disintegrating tablet Take 1 tablet (8 mg total) by mouth every 8 (eight) hours as needed for nausea or vomiting. 10 tablet 0   oxycodone (OXY-IR) 5 MG capsule Take 1 capsule (5 mg total) by mouth every 4 (four) hours as needed (severe pain). 20 capsule 0   No current facility-administered medications for this visit.   No results found.  Review of Systems:   A ROS was performed including pertinent positives and negatives as documented in the HPI.   Musculoskeletal Exam:    There were no vitals taken for this visit.  Right lateral  ankle incision is well-healed.  No erythema or drainage.  Is able to dorsiflex and plantarflex her approximately 10 degrees expected swelling about the ankle.  2+ dorsalis pedis pulse  Imaging:    X-ray 3 views right ankle: Status post open duction internal fixation without evidence of hardware failure  I personally reviewed and interpreted the radiographs.   Assessment:   66 year old male who is 2 weeks status post open duction internal fixation of right ankle, overall doing very well.  He will progress his weightbearing at this time.  This time I am okay with weightbearing as tolerated to tolerance given the strength of his repair construct.  He will be in the boot for an additional 2 weeks.  He will come out of this for showering range of motion as well as sleeping.  Plan :    -Return to clinic in 4 weeks -We will also plan to refer him for home health for visiting physical therapy to work with him.      I personally saw and evaluated the patient, and participated in the management and treatment plan.  Huel Cote, MD Attending Physician, Orthopedic Surgery  This document was dictated using Dragon voice recognition software. A reasonable attempt at proof reading has been made to minimize errors.

## 2021-11-11 ENCOUNTER — Other Ambulatory Visit (HOSPITAL_BASED_OUTPATIENT_CLINIC_OR_DEPARTMENT_OTHER): Payer: Self-pay | Admitting: Orthopaedic Surgery

## 2021-11-11 DIAGNOSIS — S82841A Displaced bimalleolar fracture of right lower leg, initial encounter for closed fracture: Secondary | ICD-10-CM

## 2021-11-12 ENCOUNTER — Other Ambulatory Visit: Payer: Self-pay

## 2021-11-12 ENCOUNTER — Other Ambulatory Visit (HOSPITAL_BASED_OUTPATIENT_CLINIC_OR_DEPARTMENT_OTHER): Payer: Self-pay

## 2021-11-12 ENCOUNTER — Encounter (HOSPITAL_BASED_OUTPATIENT_CLINIC_OR_DEPARTMENT_OTHER): Payer: 59 | Admitting: Orthopaedic Surgery

## 2021-11-12 ENCOUNTER — Ambulatory Visit (HOSPITAL_BASED_OUTPATIENT_CLINIC_OR_DEPARTMENT_OTHER)
Admission: RE | Admit: 2021-11-12 | Discharge: 2021-11-12 | Disposition: A | Discharge status: Home / Self Care | Payer: 59 | Source: Ambulatory Visit | Attending: Orthopaedic Surgery | Admitting: Orthopaedic Surgery

## 2021-11-12 ENCOUNTER — Ambulatory Visit (INDEPENDENT_AMBULATORY_CARE_PROVIDER_SITE_OTHER): Payer: 59 | Admitting: Orthopaedic Surgery

## 2021-11-12 DIAGNOSIS — S82841A Displaced bimalleolar fracture of right lower leg, initial encounter for closed fracture: Secondary | ICD-10-CM | POA: Diagnosis not present

## 2021-11-12 MED ORDER — MELOXICAM 15 MG PO TABS: 15.0000 mg | ORAL_TABLET | Freq: Every day | ORAL | 0 refills | Status: AC

## 2021-11-12 NOTE — Addendum Note (Signed)
Addended by: Benancio Deeds on: 11/12/2021 12:32 PM ? ? Modules accepted: Orders ? ?

## 2021-11-12 NOTE — Progress Notes (Signed)
? ?                            ? ? ?Post Operative Evaluation ?  ? ?Procedure/Date of Surgery: Right ankle open reduction internal fixation done September 30, 2021 ? ?Interval History:  ? ?Today 6 weeks status post right ankle open reduction internal fixation.  He has been in [place] visiting his son.  He is not working on physical therapy but he has been progressively putting weight on the right ankle.  This is coming slow.  He does have a knee scooter which she is using.  He is not willing to participate in physical therapy at this time as he believes he is improving. ? ?PMH/PSH/Family History/Social History/Meds/Allergies:   ? ?Past Medical History:  ?Diagnosis Date  ? Coronary artery disease   ? Inferior myocardial infarction-RCA DES, stent 01/02/12, CTO of prox-mid LAD  ? Gastro - esophageal reflux   ? Hyperlipidemia   ? Hypertension   ? ?Past Surgical History:  ?Procedure Laterality Date  ? CORONARY STENT PLACEMENT    ? LEFT HEART CATH N/A 01/02/2012  ? Procedure: LEFT HEART CATH;  Surgeon: Lesleigh Noe, MD;  Location: [facility] CATH LAB;  Service: Cardiovascular;  Laterality: N/A;  ? LEFT HEART CATHETERIZATION WITH CORONARY ANGIOGRAM Right 01/26/2013  ? Procedure: LEFT HEART CATHETERIZATION WITH CORONARY ANGIOGRAM Possible PCI;  Surgeon: Lesleigh Noe, MD;  Location: [facility] CATH LAB;  Service: Cardiovascular;  Laterality: Right;  ? ORIF ANKLE FRACTURE Right 09/30/2021  ? Procedure: OPEN REDUCTION INTERNAL FIXATION (ORIF) ANKLE FRACTURE;  Surgeon: Huel Cote, MD;  Location: [facility] OR;  Service: Orthopedics;  Laterality: Right;  ? ?Social History  ? ?Socioeconomic History  ? Marital status: Single  ?  Spouse name: Not on file  ? Number of children: Not on file  ? Years of education: Not on file  ? Highest education level: Not on file  ?Occupational History  ? Not on file  ?Tobacco Use  ? Smoking status: Never  ? Smokeless tobacco: Not on file  ?Substance and Sexual Activity  ? Alcohol use: No  ? Drug use: No  ?  Sexual activity: Not on file  ?Other Topics Concern  ? Not on file  ?Social History Narrative  ? Not on file  ? ?Social Determinants of Health  ? ?Financial Resource Strain: Not on file  ?Food Insecurity: Not on file  ?Transportation Needs: Not on file  ?Physical Activity: Not on file  ?Stress: Not on file  ?Social Connections: Not on file  ? ?No family history on file. ?Allergies  ?Allergen Reactions  ? Shrimp [Shellfish Allergy] Anaphylaxis  ? Pravastatin Other (See Comments)  ?  Muscle pain and weakness  ? ?Current Outpatient Medications  ?Medication Sig Dispense Refill  ? aspirin 325 MG EC tablet Take 325 mg by mouth daily.    ? atorvastatin (LIPITOR) 20 MG tablet Take 20 mg by mouth daily.    ? Carboxymethylcellul-Glycerin (CLEAR EYES FOR DRY EYES) 1-0.25 % SOLN Place 1 drop into both eyes daily as needed (dry eye).    ? clopidogrel (PLAVIX) 75 MG tablet Take 1 tablet (75 mg total) by mouth daily with breakfast.    ? doxazosin (CARDURA) 8 MG tablet Take 8 mg by mouth daily.    ? HYDROcodone-acetaminophen (NORCO/VICODIN) 5-325 MG tablet Take 1 tablet by mouth every 6 (six) hours as needed for moderate pain. 30 tablet 0  ?  nitroGLYCERIN (NITROSTAT) 0.4 MG SL tablet Place 1 tablet (0.4 mg total) under the tongue every 5 (five) minutes x 3 doses as needed for chest pain. 25 tablet 5  ? ondansetron (ZOFRAN ODT) 4 MG disintegrating tablet Take 1 tablet (4 mg total) by mouth every 6 (six) hours as needed for nausea or vomiting. (Patient not taking: Reported on 09/25/2021) 20 tablet 0  ? ondansetron (ZOFRAN-ODT) 8 MG disintegrating tablet Take 1 tablet (8 mg total) by mouth every 8 (eight) hours as needed for nausea or vomiting. 10 tablet 0  ? oxycodone (OXY-IR) 5 MG capsule Take 1 capsule (5 mg total) by mouth every 4 (four) hours as needed (severe pain). 20 capsule 0  ? ?No current facility-administered medications for this visit.  ? ?No results found. ? ?Review of Systems:   ?A ROS was performed including pertinent  positives and negatives as documented in the HPI. ? ? ?Musculoskeletal Exam:   ? ?There were no vitals taken for this visit. ? ?Right lateral ankle incision is well-healed.  No erythema or drainage.  Is able to dorsiflex and plantarflex her approximately 10 degrees expected swelling about the ankle.  2+ dorsalis pedis pulse ? ?Imaging:   ? ?X-ray 3 views right ankle: ?Status post open duction internal fixation without evidence of hardware failure.  There is increasing fracture healing ? ?I personally reviewed and interpreted the radiographs. ? ? ?Assessment:   ?66 year old male who is 6 weeks status post right ankle open reduction internal fixation.  He continues to make slow improvement.  I would like him to continue to bear weight as tolerated.  Return to clinic in 6 weeks ? ?Plan :   ? ?-Return to clinic in 6 weeks ? ? ? ? ? ?I personally saw and evaluated the patient, and participated in the management and treatment plan. ? ?Huel Cote, MD ?Attending Physician, Orthopedic Surgery ? ?This document was dictated using Conservation officer, historic buildings. A reasonable attempt at proof reading has been made to minimize errors. ?

## 2021-12-31 ENCOUNTER — Encounter (HOSPITAL_BASED_OUTPATIENT_CLINIC_OR_DEPARTMENT_OTHER): Payer: 59 | Admitting: Orthopaedic Surgery

## 2022-09-07 DIAGNOSIS — Z419 Encounter for procedure for purposes other than remedying health state, unspecified: Secondary | ICD-10-CM | POA: Diagnosis not present

## 2022-10-08 DIAGNOSIS — Z419 Encounter for procedure for purposes other than remedying health state, unspecified: Secondary | ICD-10-CM | POA: Diagnosis not present

## 2022-11-06 DIAGNOSIS — Z419 Encounter for procedure for purposes other than remedying health state, unspecified: Secondary | ICD-10-CM | POA: Diagnosis not present

## 2022-11-16 DIAGNOSIS — Z1211 Encounter for screening for malignant neoplasm of colon: Secondary | ICD-10-CM | POA: Diagnosis not present

## 2022-11-16 DIAGNOSIS — E785 Hyperlipidemia, unspecified: Secondary | ICD-10-CM | POA: Diagnosis not present

## 2022-11-16 DIAGNOSIS — I10 Essential (primary) hypertension: Secondary | ICD-10-CM | POA: Diagnosis not present

## 2022-11-16 DIAGNOSIS — I219 Acute myocardial infarction, unspecified: Secondary | ICD-10-CM | POA: Diagnosis not present

## 2022-11-16 DIAGNOSIS — R195 Other fecal abnormalities: Secondary | ICD-10-CM | POA: Diagnosis not present

## 2022-11-16 DIAGNOSIS — Z0000 Encounter for general adult medical examination without abnormal findings: Secondary | ICD-10-CM | POA: Diagnosis not present

## 2022-11-16 DIAGNOSIS — D229 Melanocytic nevi, unspecified: Secondary | ICD-10-CM | POA: Diagnosis not present

## 2022-11-26 ENCOUNTER — Encounter: Payer: Self-pay | Admitting: *Deleted

## 2022-12-07 DIAGNOSIS — Z419 Encounter for procedure for purposes other than remedying health state, unspecified: Secondary | ICD-10-CM | POA: Diagnosis not present

## 2023-01-06 DIAGNOSIS — Z419 Encounter for procedure for purposes other than remedying health state, unspecified: Secondary | ICD-10-CM | POA: Diagnosis not present

## 2023-01-21 IMAGING — CT CT RENAL STONE PROTOCOL
2 of 4 series · 15 of 46 positions shown, 17 images · non-contrast
Comparison: CT with oral contrast only 11/12/2005

CLINICAL DATA: Flank pain.

EXAM:
CT ABDOMEN AND PELVIS WITHOUT CONTRAST
TECHNIQUE: Multidetector CT imaging of the abdomen and pelvis was performed
following the standard protocol without IV contrast.

[Series 2: stone full standard · axial · 0.98mm/px · z∈[-1186,-706]mm · 12 of 106 slices shown, 14 images]
[im 5/106  soft-tissue]
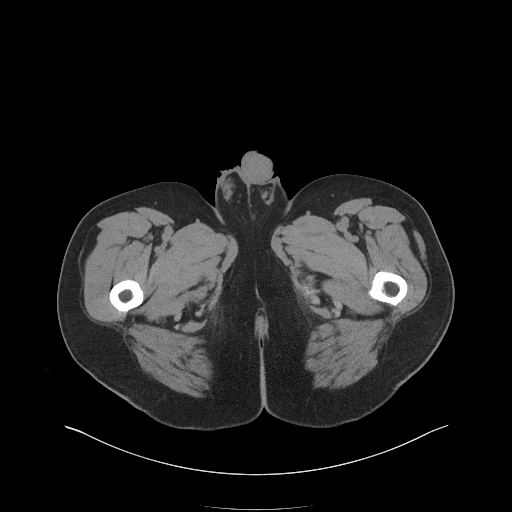
[im 5/106  bone]
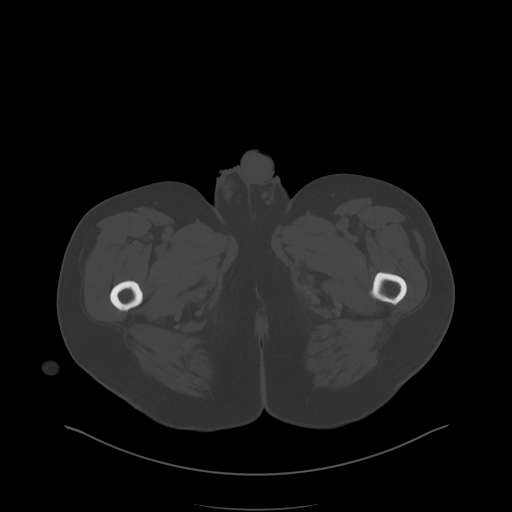
[im 14/106  soft-tissue]
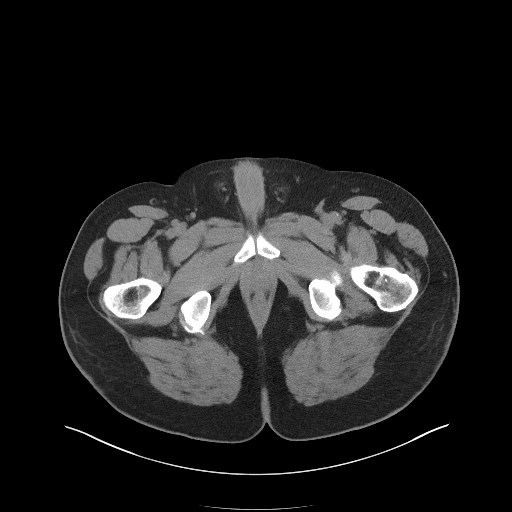
[im 23/106  soft-tissue]
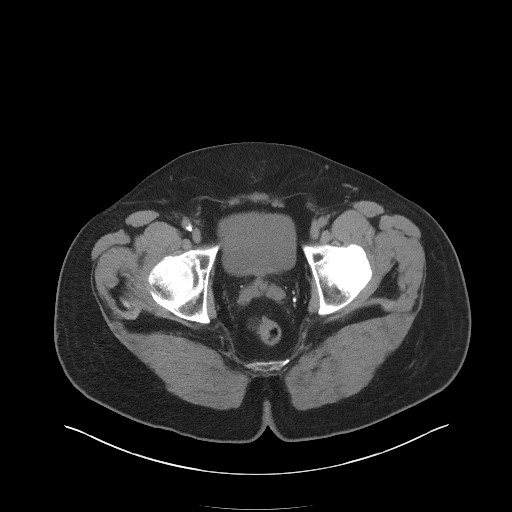
[im 32/106  soft-tissue]
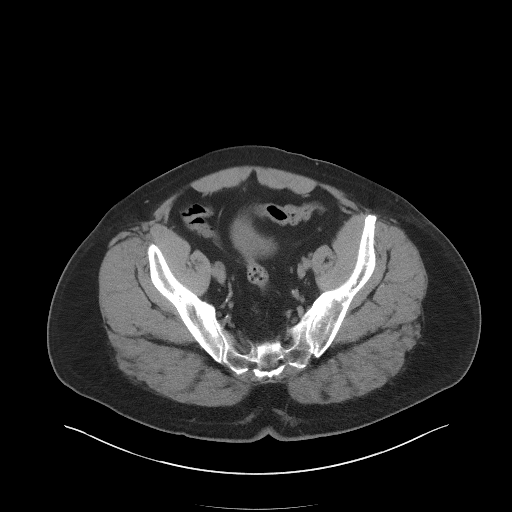
[im 42/106  soft-tissue]
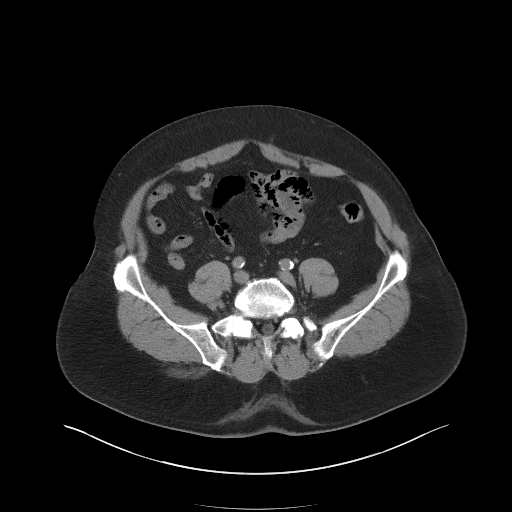
[im 51/106  soft-tissue]
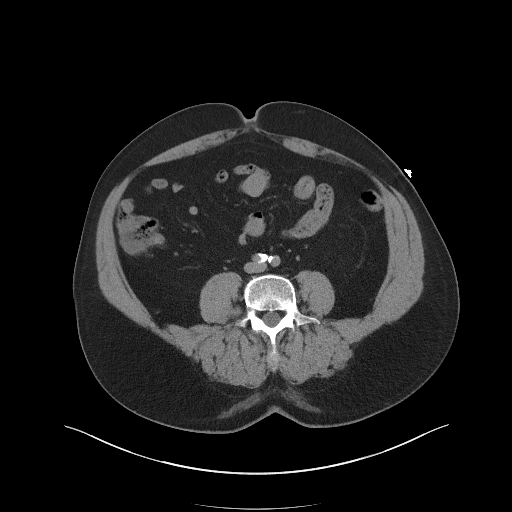
[im 55/106  soft-tissue]
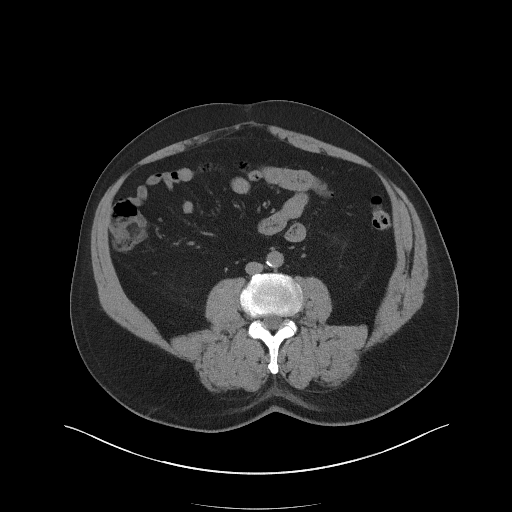
[im 64/106  soft-tissue]
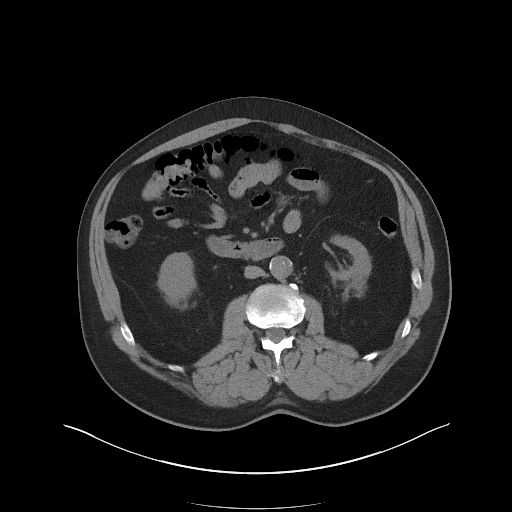
[im 74/106  soft-tissue]
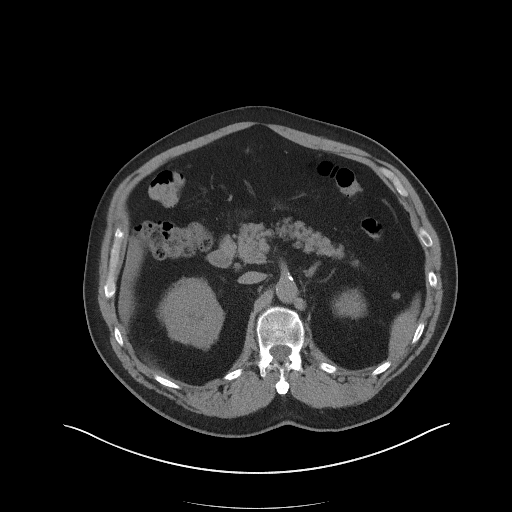
[im 74/106  bone]
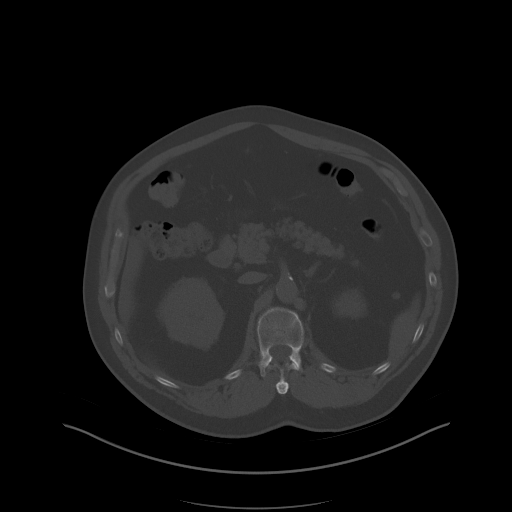
[im 83/106  soft-tissue]
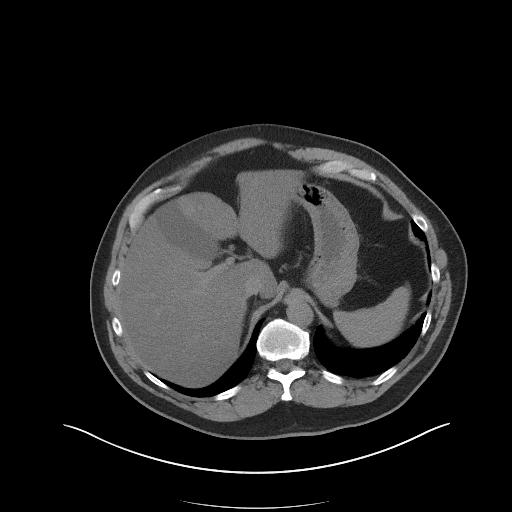
[im 92/106  soft-tissue]
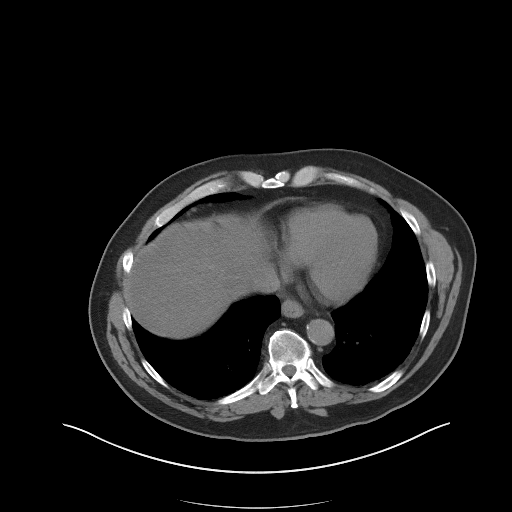
[im 101/106  soft-tissue]
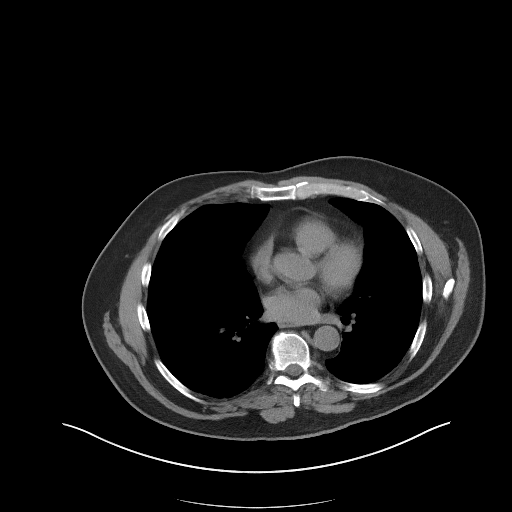

[Series 5: coronal · coronal · 0.83mm/px · 3 of 171 slices shown]
[im 57/171  soft-tissue]
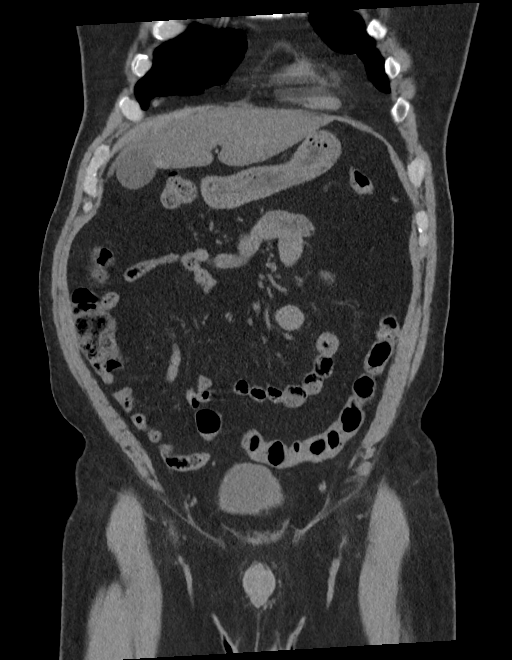
[im 76/171  soft-tissue]
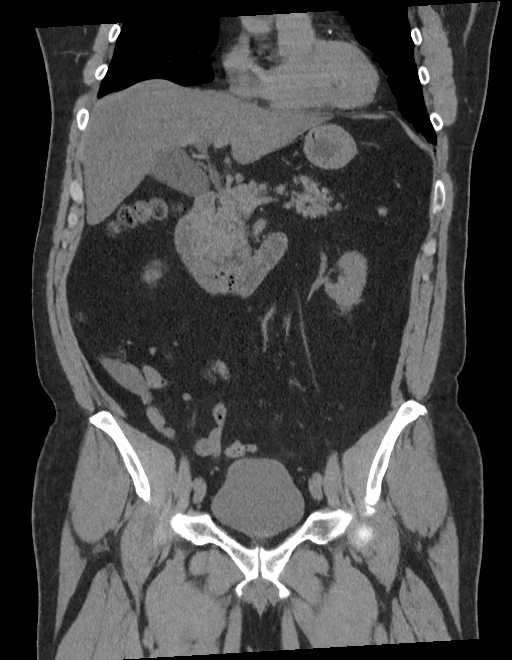
[im 95/171  soft-tissue]
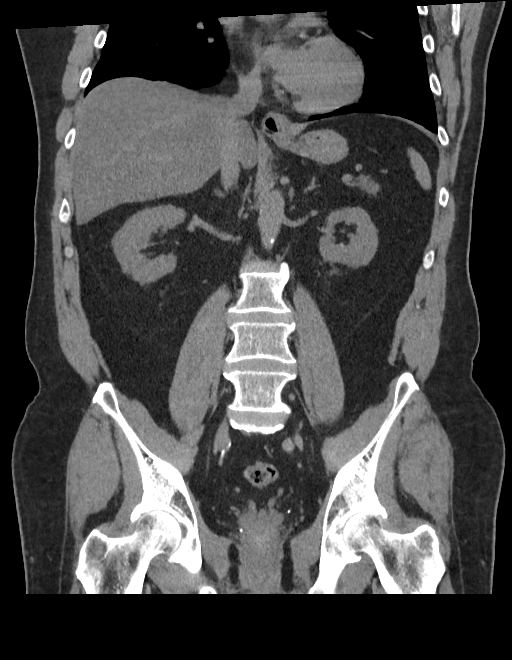

[15 of 46 positions shown; findings below may reference images not displayed]

FINDINGS: Lower chest: The cardiac size is normal. There is patchy
three-vessel calcific CAD. Small hiatal hernia. Lung bases are
clear.

Hepatobiliary: There are moderate features of hepatic steatosis
increased from the prior study. Gallbladder bile ducts are
unremarkable.

Pancreas: Partial atrophy of the tail segment. No other focal
abnormality without contrast.

Spleen: Normal in noncontrast attenuation and size.

Adrenals/Urinary Tract: There is no adrenal mass. There is increased
cortical scarring along the inferior pole of the left kidney in the
interval, with no other focal abnormality of unenhanced renal
cortex. Symmetric perinephric stranding is increased over the prior
study but is symmetric and possibly chronic. There is no evidence of
urinary stone or obstruction, unremarkable bladder.

Stomach/Bowel: No dilatation or wall thickening including the
appendix. Scattered uncomplicated diverticulosis.

Vascular/Lymphatic: Aortic atherosclerosis. No enlarged abdominal or
pelvic lymph nodes. The aorta and iliac arteries are increasingly
calcified compared with 1221 and there has developed a saccular
infrarenal AAA measuring 3.1 cm AP and 2.3 cm transverse and
extending a length of 3.4 cm.

Reproductive: The prostate mildly enlarged. Chunky calcifications in
the central prostate are again shown.

Other: There are small umbilical and inguinal fat hernias, increased
subcutaneous and abdominal fat in the interval consistent with
weight gain. There is no free air, hemorrhage or fluid.

Musculoskeletal: There are bridging osteophytes at the anterior SI
joints. There is bridging bone between the spinous processes of
T11-L1. There is no worrisome regional skeletal lesion.
IMPRESSION: 1. No evidence of urinary stones or obstruction.
2. Increased symmetric perinephric fat stranding. This is probably
chronic and age-related, remotely could be due to pyelonephritis but
there is no thickening of the bladder wall.
3. Mild prostatomegaly.
4. Increasingly fatty liver.
5. Three-vessel calcific CAD with interval development of a 3.1 x
2.3 cm saccular infrarenal AAA with increasing aortoiliac calcific
disease since 1221. Vascular surgery referral recommended.
6. Small hiatal hernia, small umbilical and inguinal fat hernias.

## 2023-02-06 DIAGNOSIS — Z419 Encounter for procedure for purposes other than remedying health state, unspecified: Secondary | ICD-10-CM | POA: Diagnosis not present

## 2023-03-04 IMAGING — DX DG ANKLE COMPLETE 3+V*R*
1 series · 3 of 3 positions shown · non-contrast
Comparison: 09/05/2021

CLINICAL DATA: Right ankle fracture, post reduction imaging

EXAM:
RIGHT ANKLE - COMPLETE 3+ VIEW

[Series 1: ankle · 0.14mm/px · 3 of 3 slices shown]
[im 1/3]
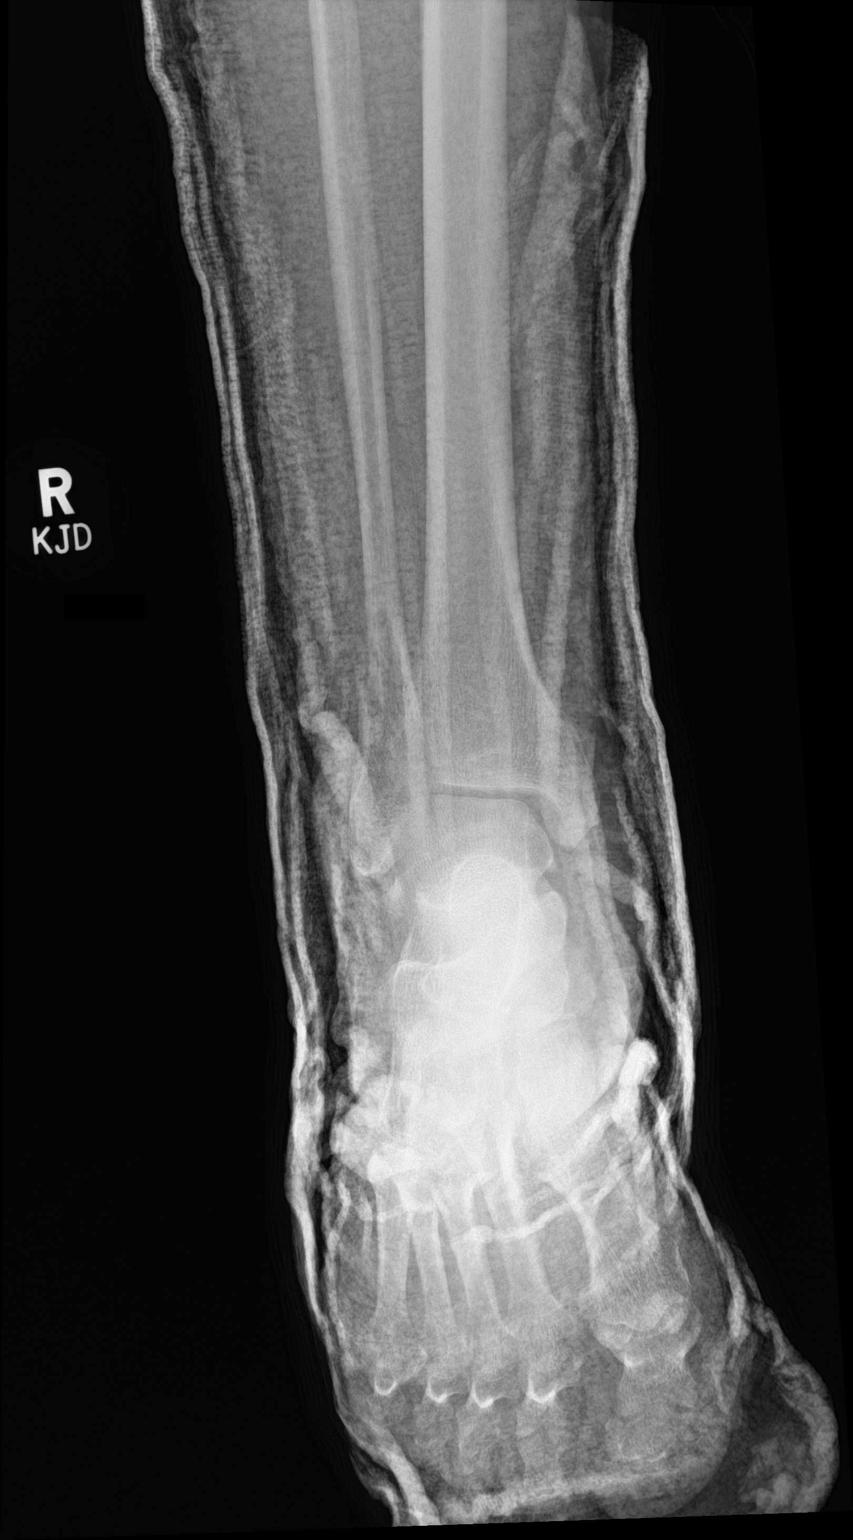
[im 2/3]
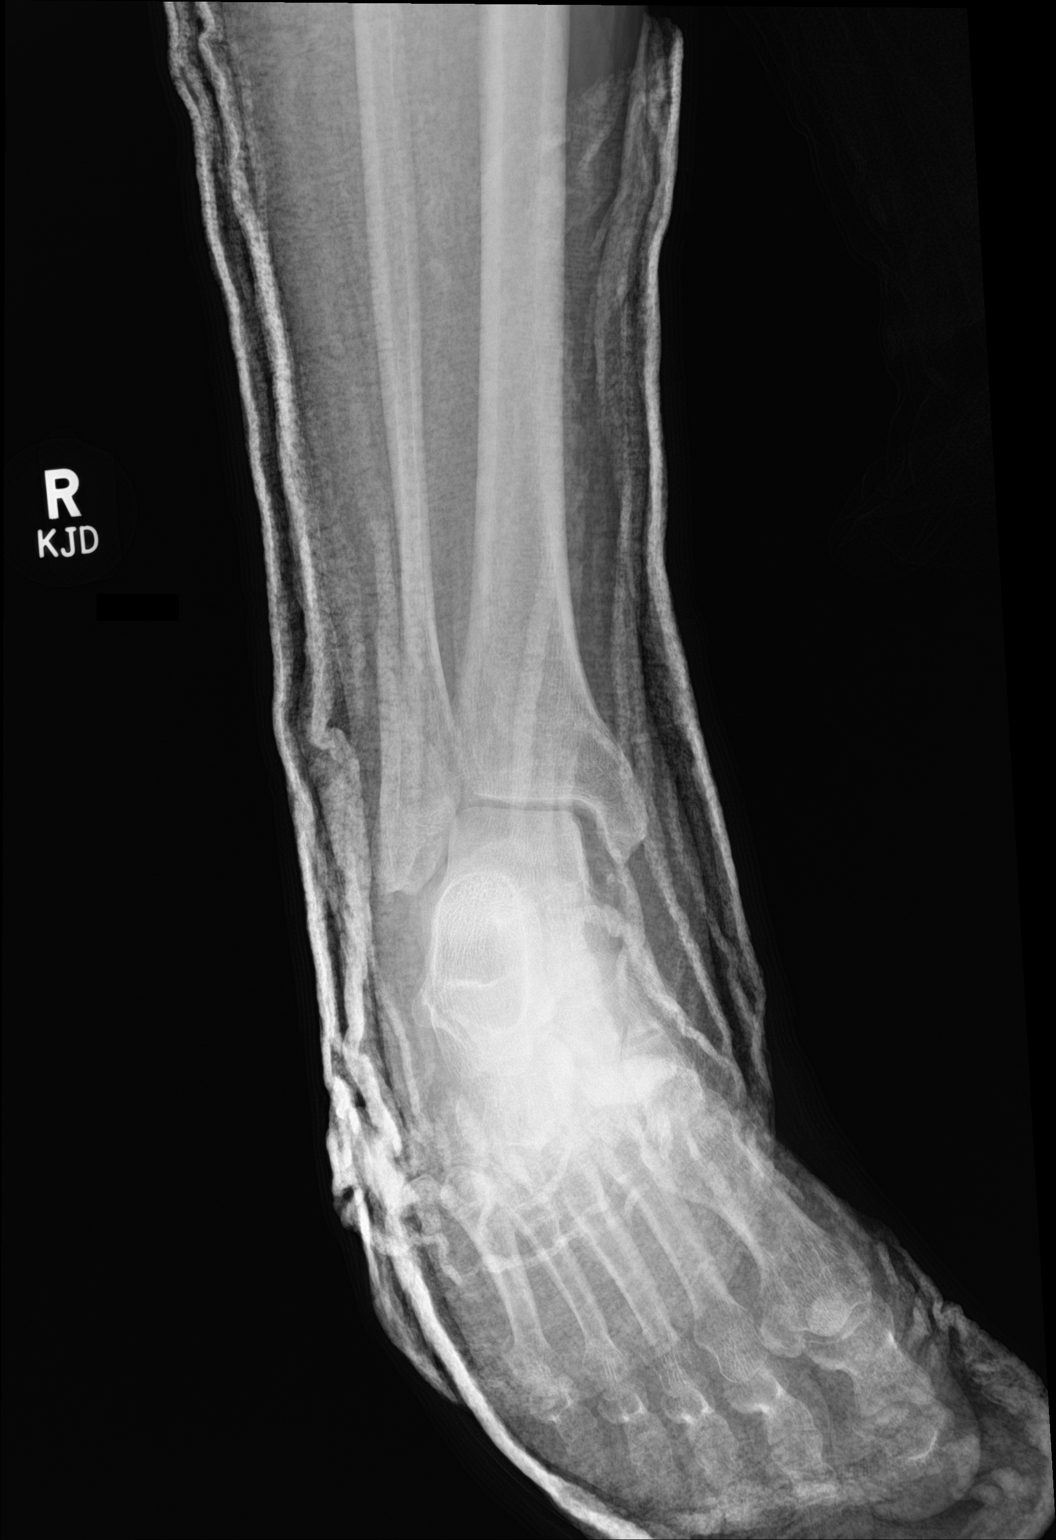
[im 3/3]
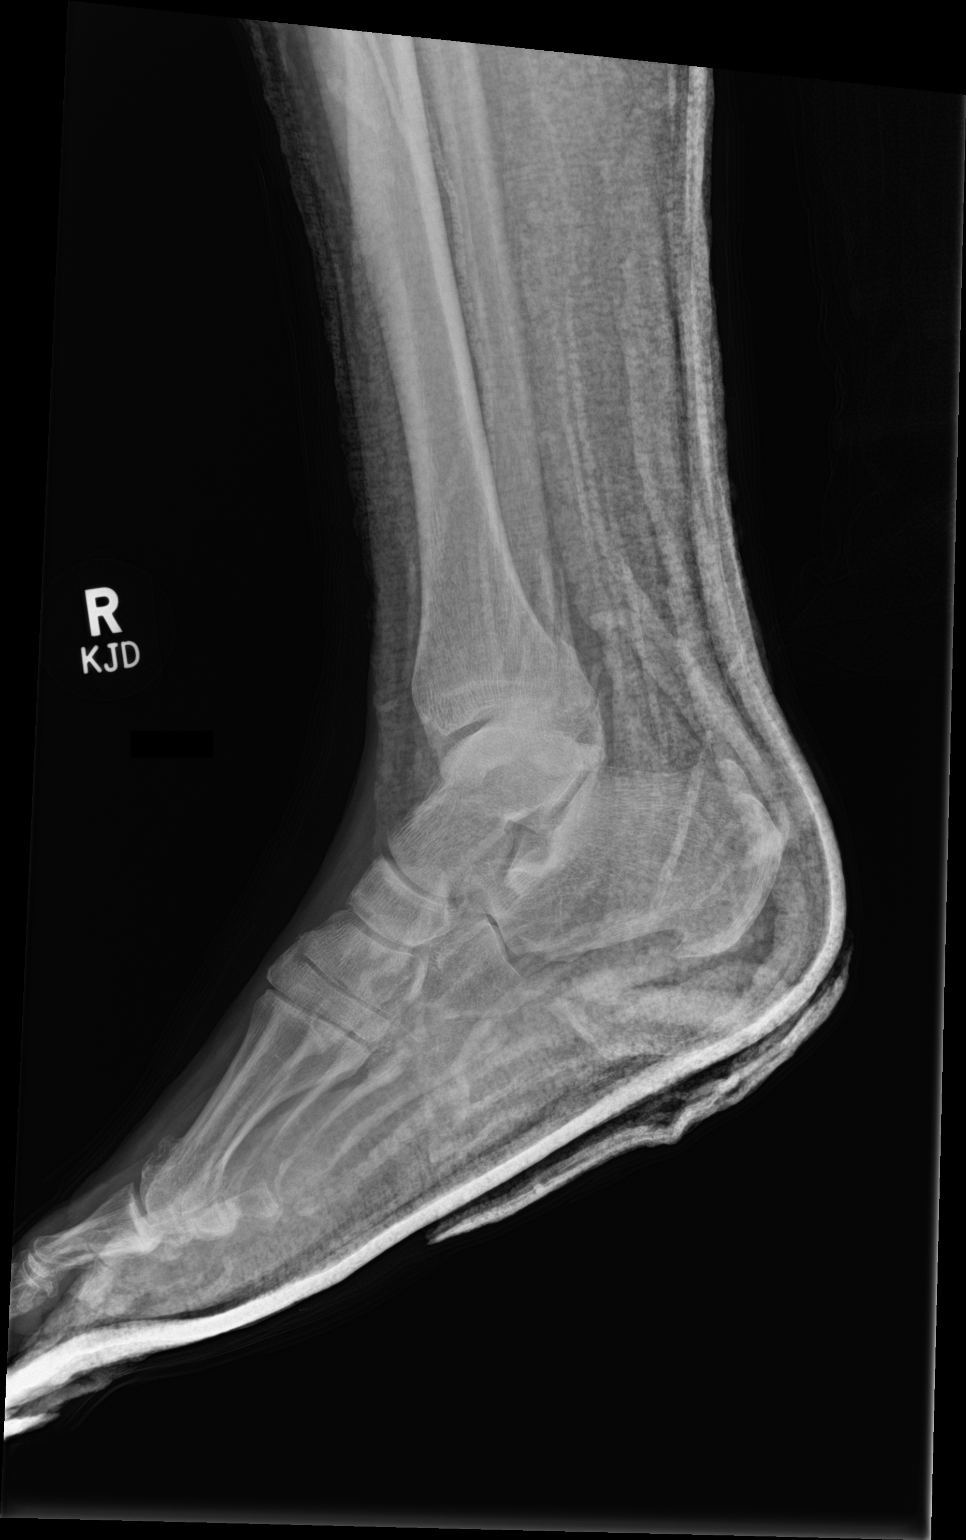

[3 of 3 positions shown; findings below may reference images not displayed]

FINDINGS: Three view radiograph right ankle performed within an external
immobilizer demonstrates grossly stable alignment with widening of
the medial joint space, 1 cortical with posterior and lateral
displacement of the distal fibular fracture fragment, and mild
posterior displacement of the posterior malleolar fracture fragment.
External immobilizer material obscures fine bony detail.
IMPRESSION: Interval application of an external immobilizer. Stable alignment of
bimalleolar fracture subluxation of the right ankle.

## 2023-03-08 DIAGNOSIS — Z419 Encounter for procedure for purposes other than remedying health state, unspecified: Secondary | ICD-10-CM | POA: Diagnosis not present

## 2023-04-08 DIAGNOSIS — Z419 Encounter for procedure for purposes other than remedying health state, unspecified: Secondary | ICD-10-CM | POA: Diagnosis not present

## 2023-05-09 DIAGNOSIS — Z419 Encounter for procedure for purposes other than remedying health state, unspecified: Secondary | ICD-10-CM | POA: Diagnosis not present

## 2023-06-08 DIAGNOSIS — Z419 Encounter for procedure for purposes other than remedying health state, unspecified: Secondary | ICD-10-CM | POA: Diagnosis not present

## 2023-08-23 ENCOUNTER — Encounter: Payer: Self-pay | Admitting: *Deleted

## 2023-10-04 ENCOUNTER — Other Ambulatory Visit: Payer: Self-pay | Admitting: Physician Assistant

## 2023-10-04 DIAGNOSIS — R1084 Generalized abdominal pain: Secondary | ICD-10-CM

## 2023-10-07 ENCOUNTER — Ambulatory Visit
Admission: RE | Admit: 2023-10-07 | Discharge: 2023-10-07 | Disposition: A | Discharge status: Home / Self Care | Payer: Medicare Other | Source: Ambulatory Visit | Attending: Physician Assistant | Admitting: Physician Assistant

## 2023-10-07 DIAGNOSIS — R1084 Generalized abdominal pain: Secondary | ICD-10-CM

## 2023-10-11 ENCOUNTER — Encounter: Payer: Self-pay | Admitting: *Deleted

## 2024-07-26 ENCOUNTER — Other Ambulatory Visit: Payer: Self-pay

## 2024-07-26 DIAGNOSIS — R109 Unspecified abdominal pain: Secondary | ICD-10-CM

## 2024-08-09 ENCOUNTER — Ambulatory Visit: Admission: RE | Admit: 2024-08-09 | Discharge: 2024-08-09 | Disposition: A | Source: Ambulatory Visit

## 2024-08-09 DIAGNOSIS — R109 Unspecified abdominal pain: Secondary | ICD-10-CM
